# Patient Record
Sex: Female | Born: 1985
Health system: Southern US, Community
[De-identification: ages and names within clinical notes are randomized; demographics above are authoritative.]

## PROBLEM LIST (undated history)

## (undated) DIAGNOSIS — Z8619 Personal history of other infectious and parasitic diseases: Secondary | ICD-10-CM

## (undated) DIAGNOSIS — F32A Depression, unspecified: Secondary | ICD-10-CM

## (undated) DIAGNOSIS — F909 Attention-deficit hyperactivity disorder, unspecified type: Secondary | ICD-10-CM

## (undated) DIAGNOSIS — F419 Anxiety disorder, unspecified: Secondary | ICD-10-CM

## (undated) DIAGNOSIS — N3010 Interstitial cystitis (chronic) without hematuria: Secondary | ICD-10-CM

## (undated) DIAGNOSIS — G4700 Insomnia, unspecified: Secondary | ICD-10-CM

## (undated) HISTORY — PX: KNEE ARTHROSCOPY W/ MENISCAL REPAIR: SHX1877

## (undated) HISTORY — DX: Anxiety disorder, unspecified: F41.9

## (undated) HISTORY — DX: Interstitial cystitis (chronic) without hematuria: N30.10

## (undated) HISTORY — DX: Attention-deficit hyperactivity disorder, unspecified type: F90.9

## (undated) HISTORY — DX: Insomnia, unspecified: G47.00

## (undated) HISTORY — DX: Depression, unspecified: F32.A

## (undated) HISTORY — DX: Personal history of other infectious and parasitic diseases: Z86.19

---

## 2014-08-18 DIAGNOSIS — Z8742 Personal history of other diseases of the female genital tract: Secondary | ICD-10-CM | POA: Insufficient documentation

## 2014-08-18 DIAGNOSIS — E66811 Obesity, class 1: Secondary | ICD-10-CM | POA: Insufficient documentation

## 2014-08-18 DIAGNOSIS — E669 Obesity, unspecified: Secondary | ICD-10-CM | POA: Insufficient documentation

## 2016-07-20 DIAGNOSIS — J340 Abscess, furuncle and carbuncle of nose: Secondary | ICD-10-CM | POA: Diagnosis not present

## 2016-09-19 DIAGNOSIS — Z8742 Personal history of other diseases of the female genital tract: Secondary | ICD-10-CM | POA: Diagnosis not present

## 2016-09-19 DIAGNOSIS — Z30430 Encounter for insertion of intrauterine contraceptive device: Secondary | ICD-10-CM | POA: Diagnosis not present

## 2016-10-11 DIAGNOSIS — Z113 Encounter for screening for infections with a predominantly sexual mode of transmission: Secondary | ICD-10-CM | POA: Diagnosis not present

## 2016-10-11 DIAGNOSIS — Z30432 Encounter for removal of intrauterine contraceptive device: Secondary | ICD-10-CM | POA: Diagnosis not present

## 2016-10-11 DIAGNOSIS — Z01812 Encounter for preprocedural laboratory examination: Secondary | ICD-10-CM | POA: Diagnosis not present

## 2016-10-11 DIAGNOSIS — Z30430 Encounter for insertion of intrauterine contraceptive device: Secondary | ICD-10-CM | POA: Diagnosis not present

## 2016-11-01 DIAGNOSIS — Z30433 Encounter for removal and reinsertion of intrauterine contraceptive device: Secondary | ICD-10-CM | POA: Diagnosis not present

## 2016-11-01 DIAGNOSIS — T8332XA Displacement of intrauterine contraceptive device, initial encounter: Secondary | ICD-10-CM | POA: Diagnosis not present

## 2016-11-01 DIAGNOSIS — R102 Pelvic and perineal pain: Secondary | ICD-10-CM | POA: Diagnosis not present

## 2016-12-10 DIAGNOSIS — R05 Cough: Secondary | ICD-10-CM | POA: Diagnosis not present

## 2016-12-13 DIAGNOSIS — R319 Hematuria, unspecified: Secondary | ICD-10-CM | POA: Diagnosis not present

## 2016-12-16 DIAGNOSIS — R350 Frequency of micturition: Secondary | ICD-10-CM | POA: Diagnosis not present

## 2016-12-16 DIAGNOSIS — R310 Gross hematuria: Secondary | ICD-10-CM | POA: Diagnosis not present

## 2016-12-16 DIAGNOSIS — N390 Urinary tract infection, site not specified: Secondary | ICD-10-CM | POA: Diagnosis not present

## 2016-12-27 DIAGNOSIS — N3020 Other chronic cystitis without hematuria: Secondary | ICD-10-CM | POA: Diagnosis not present

## 2017-01-18 DIAGNOSIS — N200 Calculus of kidney: Secondary | ICD-10-CM | POA: Diagnosis not present

## 2017-01-18 DIAGNOSIS — R310 Gross hematuria: Secondary | ICD-10-CM | POA: Diagnosis not present

## 2017-01-18 DIAGNOSIS — N3020 Other chronic cystitis without hematuria: Secondary | ICD-10-CM | POA: Diagnosis not present

## 2017-02-10 DIAGNOSIS — N3020 Other chronic cystitis without hematuria: Secondary | ICD-10-CM | POA: Diagnosis not present

## 2017-02-10 DIAGNOSIS — R350 Frequency of micturition: Secondary | ICD-10-CM | POA: Diagnosis not present

## 2017-02-14 DIAGNOSIS — F411 Generalized anxiety disorder: Secondary | ICD-10-CM | POA: Diagnosis not present

## 2017-02-24 DIAGNOSIS — N3020 Other chronic cystitis without hematuria: Secondary | ICD-10-CM | POA: Diagnosis not present

## 2017-02-24 DIAGNOSIS — N3010 Interstitial cystitis (chronic) without hematuria: Secondary | ICD-10-CM | POA: Diagnosis not present

## 2017-03-17 DIAGNOSIS — R350 Frequency of micturition: Secondary | ICD-10-CM | POA: Diagnosis not present

## 2017-04-22 DIAGNOSIS — N760 Acute vaginitis: Secondary | ICD-10-CM | POA: Diagnosis not present

## 2017-06-28 DIAGNOSIS — F411 Generalized anxiety disorder: Secondary | ICD-10-CM | POA: Diagnosis not present

## 2017-08-07 DIAGNOSIS — N760 Acute vaginitis: Secondary | ICD-10-CM | POA: Diagnosis not present

## 2017-08-07 DIAGNOSIS — Z124 Encounter for screening for malignant neoplasm of cervix: Secondary | ICD-10-CM | POA: Diagnosis not present

## 2017-08-07 DIAGNOSIS — N898 Other specified noninflammatory disorders of vagina: Secondary | ICD-10-CM | POA: Diagnosis not present

## 2017-08-07 DIAGNOSIS — Z01419 Encounter for gynecological examination (general) (routine) without abnormal findings: Secondary | ICD-10-CM | POA: Diagnosis not present

## 2017-08-07 LAB — HM PAP SMEAR: HM Pap smear: NEGATIVE

## 2017-09-20 DIAGNOSIS — L02416 Cutaneous abscess of left lower limb: Secondary | ICD-10-CM | POA: Diagnosis not present

## 2017-10-25 DIAGNOSIS — F909 Attention-deficit hyperactivity disorder, unspecified type: Secondary | ICD-10-CM | POA: Diagnosis not present

## 2017-10-25 DIAGNOSIS — F411 Generalized anxiety disorder: Secondary | ICD-10-CM | POA: Diagnosis not present

## 2017-12-06 DIAGNOSIS — B373 Candidiasis of vulva and vagina: Secondary | ICD-10-CM | POA: Diagnosis not present

## 2018-01-22 DIAGNOSIS — F900 Attention-deficit hyperactivity disorder, predominantly inattentive type: Secondary | ICD-10-CM | POA: Diagnosis not present

## 2018-01-22 DIAGNOSIS — F411 Generalized anxiety disorder: Secondary | ICD-10-CM | POA: Diagnosis not present

## 2018-02-15 DIAGNOSIS — N760 Acute vaginitis: Secondary | ICD-10-CM | POA: Diagnosis not present

## 2018-02-15 DIAGNOSIS — N898 Other specified noninflammatory disorders of vagina: Secondary | ICD-10-CM | POA: Diagnosis not present

## 2018-02-15 DIAGNOSIS — N926 Irregular menstruation, unspecified: Secondary | ICD-10-CM | POA: Diagnosis not present

## 2018-02-15 DIAGNOSIS — Z30431 Encounter for routine checking of intrauterine contraceptive device: Secondary | ICD-10-CM | POA: Diagnosis not present

## 2018-03-01 DIAGNOSIS — Z3202 Encounter for pregnancy test, result negative: Secondary | ICD-10-CM | POA: Diagnosis not present

## 2018-03-01 DIAGNOSIS — B373 Candidiasis of vulva and vagina: Secondary | ICD-10-CM | POA: Diagnosis not present

## 2018-03-07 DIAGNOSIS — R232 Flushing: Secondary | ICD-10-CM | POA: Diagnosis not present

## 2018-03-07 DIAGNOSIS — R4586 Emotional lability: Secondary | ICD-10-CM | POA: Diagnosis not present

## 2018-03-07 DIAGNOSIS — N898 Other specified noninflammatory disorders of vagina: Secondary | ICD-10-CM | POA: Diagnosis not present

## 2018-03-07 DIAGNOSIS — N760 Acute vaginitis: Secondary | ICD-10-CM | POA: Diagnosis not present

## 2018-03-07 DIAGNOSIS — R5383 Other fatigue: Secondary | ICD-10-CM | POA: Diagnosis not present

## 2018-05-02 DIAGNOSIS — F900 Attention-deficit hyperactivity disorder, predominantly inattentive type: Secondary | ICD-10-CM | POA: Diagnosis not present

## 2018-05-02 DIAGNOSIS — F411 Generalized anxiety disorder: Secondary | ICD-10-CM | POA: Diagnosis not present

## 2018-05-30 ENCOUNTER — Other Ambulatory Visit: Payer: Self-pay

## 2018-05-30 ENCOUNTER — Encounter: Payer: Self-pay | Admitting: Physician Assistant

## 2018-05-30 ENCOUNTER — Ambulatory Visit (INDEPENDENT_AMBULATORY_CARE_PROVIDER_SITE_OTHER): Payer: BLUE CROSS/BLUE SHIELD | Admitting: Physician Assistant

## 2018-05-30 VITALS — BP 116/86 | HR 88 | Temp 97.7°F | Resp 14 | Ht 69.0 in | Wt 210.0 lb

## 2018-05-30 DIAGNOSIS — F901 Attention-deficit hyperactivity disorder, predominantly hyperactive type: Secondary | ICD-10-CM | POA: Insufficient documentation

## 2018-05-30 DIAGNOSIS — E282 Polycystic ovarian syndrome: Secondary | ICD-10-CM | POA: Diagnosis not present

## 2018-05-30 LAB — LIPID PANEL
Cholesterol: 159 mg/dL (ref 0–200)
HDL: 62.4 mg/dL (ref >39.00)
LDL Cholesterol: 82 mg/dL (ref 0–99)
NonHDL: 96.95
Total CHOL/HDL Ratio: 3
Triglycerides: 75 mg/dL (ref 0.0–149.0)
VLDL: 15 mg/dL (ref 0.0–40.0)

## 2018-05-30 LAB — COMPREHENSIVE METABOLIC PANEL
ALT: 12 U/L (ref 0–35)
AST: 15 U/L (ref 0–37)
Albumin: 4.3 g/dL (ref 3.5–5.2)
Alkaline Phosphatase: 65 U/L (ref 39–117)
BUN: 16 mg/dL (ref 6–23)
CO2: 30 mEq/L (ref 19–32)
Calcium: 9.3 mg/dL (ref 8.4–10.5)
Chloride: 100 mEq/L (ref 96–112)
Creatinine, Ser: 1.02 mg/dL (ref 0.40–1.20)
GFR: 65.69 mL/min (ref >60.00)
Glucose, Bld: 87 mg/dL (ref 70–99)
Potassium: 4.2 mEq/L (ref 3.5–5.1)
Sodium: 136 mEq/L (ref 135–145)
Total Bilirubin: 0.3 mg/dL (ref 0.2–1.2)
Total Protein: 7 g/dL (ref 6.0–8.3)

## 2018-05-30 LAB — HEMOGLOBIN A1C: Hgb A1c MFr Bld: 5 % (ref 4.6–6.5)

## 2018-05-30 MED ORDER — METFORMIN HCL 500 MG PO TABS: 500.0000 mg | ORAL_TABLET | Freq: Two times a day (BID) | ORAL | 5 refills | Status: DC

## 2018-05-30 MED ORDER — SPIRONOLACTONE 100 MG PO TABS: 100.0000 mg | ORAL_TABLET | Freq: Every day | ORAL | 5 refills | Status: DC

## 2018-05-30 NOTE — Patient Instructions (Signed)
Please go to the lab today for blood work.  I will call you with your results. We will alter treatment regimen(s) if indicated by your results.   I have sent in refills of your medications.  Please follow-up with your specialists as scheduled.   For the interstitial cystitis, please read information below. Can consider addition of over-the-counter Tagamet for episodes if needed.    Interstitial Cystitis Interstitial cystitis is a condition that causes inflammation of the bladder. The bladder is a hollow organ in the lower part of your abdomen. It stores urine after the urine is made by your kidneys. With interstitial cystitis, you may have pain in the bladder area. You may also have a frequent and urgent need to urinate. The severity of interstitial cystitis can vary from person to person. You may have flare-ups of the condition, and then it may go away for a while. For many people who have this condition, it becomes a long-term problem. What are the causes? The cause of this condition is not known. What increases the risk? This condition is more likely to develop in women. What are the signs or symptoms? Symptoms of interstitial cystitis vary, and they can change over time. Symptoms may include:  Discomfort or pain in the bladder area. This can range from mild to severe. The pain may change in intensity as the bladder fills with urine or as it empties.  Pelvic pain.  An urgent need to urinate.  Frequent urination.  Pain during sexual intercourse.  Pinpoint bleeding on the bladder wall.  For women, the symptoms often get worse during menstruation. How is this diagnosed? This condition is diagnosed by evaluating your symptoms and ruling out other causes. A physical exam will be done. Various tests may be done to rule out other conditions. Common tests include:  Urine tests.  Cystoscopy. In this test, a tool that is like a very thin telescope is used to look into your  bladder.  Biopsy. This involves taking a sample of tissue from the bladder wall to be examined under a microscope.  How is this treated? There is no cure for interstitial cystitis, but treatment methods are available to control your symptoms. Work closely with your health care provider to find the treatments that will be most effective for you. Treatment options may include:  Medicines to relieve pain and to help reduce the number of times that you feel the need to urinate.  Bladder training. This involves learning ways to control when you urinate, such as: ? Urinating at scheduled times. ? Training yourself to delay urination. ? Doing exercises (Kegel exercises) to strengthen the muscles that control urine flow.  Lifestyle changes, such as changing your diet or taking steps to control stress.  Use of a device that provides electrical stimulation in order to reduce pain.  A procedure that stretches your bladder by filling it with air or fluid.  Surgery. This is rare. It is only done for extreme cases if other treatments do not help.  Follow these instructions at home:  Take medicines only as directed by your health care provider.  Use bladder training techniques as directed. ? Keep a bladder diary to find out which foods, liquids, or activities make your symptoms worse. ? Use your bladder diary to schedule bathroom trips. If you are away from home, plan to be near a bathroom at each of your scheduled times. ? Make sure you urinate just before you leave the house and just before you  go to bed.  Do Kegel exercises as directed by your health care provider.  Do not drink alcohol.  Do not use any tobacco products, including cigarettes, chewing tobacco, or electronic cigarettes. If you need help quitting, ask your health care provider.  Make dietary changes as directed by your health care provider. You may need to avoid spicy foods and foods that contain a high amount of  potassium.  Limit your drinking of beverages that stimulate urination. These include soda, coffee, and tea.  Keep all follow-up visits as directed by your health care provider. This is important. Contact a health care provider if:  Your symptoms do not get better after treatment.  Your pain and discomfort are getting worse.  You have more frequent urges to urinate.  You have a fever. Get help right away if:  You are not able to control your bladder at all. This information is not intended to replace advice given to you by your health care provider. Make sure you discuss any questions you have with your health care provider. Document Released: 02/05/2004 Document Revised: 11/12/2015 Document Reviewed: 02/11/2014 Elsevier Interactive Patient Education  [employer].

## 2018-05-30 NOTE — Progress Notes (Signed)
Patient presents to clinic today to establish care.  Acute Concerns: Denies acute concerns at today's visit. Is needing refills of medications.   Chronic Issues: PCOS -- Long-standing history, diagnosed in early 58s per patient. Is currently on a low-carb, low-chol diet, metformin 500 mg BID and Spironolactone 25 mg once daily. Notes she has done very well on this regimen. Has Paragard in place and notes regular periods. Denies known history of pre-diabetes or diabetes. Is unsure when this was checked last.   Health Maintenance: Immunizations -- Will obtain full records. Declines flu shot. PAP -- up-to-date. Followed by GYN.   Past Medical History:  Diagnosis Date  . History of chickenpox     Past Surgical History:  Procedure Laterality Date  . KNEE ARTHROSCOPY W/ MENISCAL REPAIR Left     Current Outpatient Medications on File Prior to Visit  Medication Sig Dispense Refill  . ALPRAZolam (XANAX) 1 MG tablet Take 1 tablet by mouth daily as needed.  2  . amphetamine-dextroamphetamine (ADDERALL) 30 MG tablet Take 1 tablet by mouth 4 (four) times daily.    . Melatonin 5 MG CAPS Take 2 capsules by mouth at bedtime.    Marland Kitchen PARAGARD INTRAUTERINE COPPER IUD IUD by Intrauterine route.    . traZODone (DESYREL) 100 MG tablet Take 1 tablet by mouth at bedtime.  0   No current facility-administered medications on file prior to visit.     Allergies  Allergen Reactions  . Adhesive [Tape] Rash    Family History  Problem Relation Age of Onset  . Multiple sclerosis Mother   . COPD Father   . Cancer Father        Lung  . Heart attack Father   . Cancer Paternal Aunt        Breast  . Multiple sclerosis Paternal Aunt   . Cancer Paternal Aunt        Breast    Social History   Socioeconomic History  . Marital status: Single    Spouse name: Not on file  . Number of children: Not on file  . Years of education: Not on file  . Highest education level: Not on file  Occupational  History  . Not on file  Social Needs  . Financial resource strain: Not on file  . Food insecurity:    Worry: Not on file    Inability: Not on file  . Transportation needs:    Medical: Not on file    Non-medical: Not on file  Tobacco Use  . Smoking status: Former Smoker    Types: Cigarettes    Last attempt to quit: 2010    Years since quitting: 9.9  . Smokeless tobacco: Never Used  Substance and Sexual Activity  . Alcohol use: Yes    Comment: 3-4 glasses of wine  . Drug use: Not Currently  . Sexual activity: Yes    Birth control/protection: IUD  Lifestyle  . Physical activity:    Days per week: Not on file    Minutes per session: Not on file  . Stress: Not on file  Relationships  . Social connections:    Talks on phone: Not on file    Gets together: Not on file    Attends religious service: Not on file    Active member of club or organization: Not on file    Attends meetings of clubs or organizations: Not on file    Relationship status: Not on file  . Intimate partner violence:  Fear of current or ex partner: Not on file    Emotionally abused: Not on file    Physically abused: Not on file    Forced sexual activity: Not on file  Other Topics Concern  . Not on file  Social History Narrative  . Not on file   Review of Systems  Constitutional: Negative for fever and weight loss.  HENT: Negative for ear discharge, ear pain, hearing loss and tinnitus.   Eyes: Negative for blurred vision, double vision, photophobia and pain.  Respiratory: Negative for cough and shortness of breath.   Cardiovascular: Negative for chest pain and palpitations.  Gastrointestinal: Negative for abdominal pain, blood in stool, constipation, diarrhea, heartburn, melena, nausea and vomiting.  Genitourinary: Negative for dysuria, flank pain, frequency, hematuria and urgency.  Musculoskeletal: Negative for falls.  Neurological: Negative for dizziness, loss of consciousness and headaches.    Endo/Heme/Allergies: Negative for environmental allergies.  Psychiatric/Behavioral: Negative for depression, hallucinations, substance abuse and suicidal ideas. The patient is not nervous/anxious and does not have insomnia.     BP 116/86   Pulse 88   Temp 97.7 F (36.5 C) (Oral)   Resp 14   Ht 5\' 9"  (1.753 m)   Wt 210 lb (95.3 kg)   SpO2 99%   BMI 31.01 kg/m   Physical Exam  Constitutional: She is oriented to person, place, and time.  HENT:  Head: Normocephalic and atraumatic.  Right Ear: Tympanic membrane, external ear and ear canal normal.  Left Ear: Tympanic membrane, external ear and ear canal normal.  Nose: Nose normal. No mucosal edema.  Mouth/Throat: Uvula is midline, oropharynx is clear and moist and mucous membranes are normal. No oropharyngeal exudate or posterior oropharyngeal erythema.  Eyes: Pupils are equal, round, and reactive to light. Conjunctivae are normal.  Neck: Neck supple. No thyromegaly present.  Cardiovascular: Normal rate, regular rhythm, normal heart sounds and intact distal pulses.  Pulmonary/Chest: Effort normal and breath sounds normal. No respiratory distress. She has no wheezes. She has no rales.  Abdominal: Soft. Bowel sounds are normal. She exhibits no distension and no mass. There is no tenderness. There is no rebound and no guarding.  Lymphadenopathy:    She has no cervical adenopathy.  Neurological: She is alert and oriented to person, place, and time. No cranial nerve deficit.  Skin: Skin is warm and dry. No rash noted.  Vitals reviewed.   Recent Results (from the past 2160 hour(s))  Lipid panel     Status: None   Collection Time: 05/30/18  9:07 AM  Result Value Ref Range   Cholesterol 159 0 - 200 mg/dL    Comment: ATP III Classification       Desirable:  < 200 mg/dL               Borderline High:  200 - 239 mg/dL          High:  > = 240 mg/dL   Triglycerides 75.0 0.0 - 149.0 mg/dL    Comment: Normal:  <150 mg/dLBorderline High:  150 -  199 mg/dL   HDL 62.40 >39.00 mg/dL   VLDL 15.0 0.0 - 40.0 mg/dL   LDL Cholesterol 82 0 - 99 mg/dL   Total CHOL/HDL Ratio 3     Comment:                Men          Women1/2 Average Risk     3.4  3.3Average Risk          5.0          4.42X Average Risk          9.6          7.13X Average Risk          15.0          11.0                       NonHDL 96.95     Comment: NOTE:  Non-HDL goal should be 30 mg/dL higher than patient's LDL goal (i.e. LDL goal of < 70 mg/dL, would have non-HDL goal of < 100 mg/dL)  Comprehensive metabolic panel     Status: None   Collection Time: 05/30/18  9:07 AM  Result Value Ref Range   Sodium 136 135 - 145 mEq/L   Potassium 4.2 3.5 - 5.1 mEq/L   Chloride 100 96 - 112 mEq/L   CO2 30 19 - 32 mEq/L   Glucose, Bld 87 70 - 99 mg/dL   BUN 16 6 - 23 mg/dL   Creatinine, Ser 1.02 0.40 - 1.20 mg/dL   Total Bilirubin 0.3 0.2 - 1.2 mg/dL   Alkaline Phosphatase 65 39 - 117 U/L   AST 15 0 - 37 U/L   ALT 12 0 - 35 U/L   Total Protein 7.0 6.0 - 8.3 g/dL   Albumin 4.3 3.5 - 5.2 g/dL   Calcium 9.3 8.4 - 10.5 mg/dL   GFR 65.69 >60.00 mL/min  Hemoglobin A1c     Status: None   Collection Time: 05/30/18  9:07 AM  Result Value Ref Range   Hgb A1c MFr Bld 5.0 4.6 - 6.5 %    Comment: Glycemic Control Guidelines for People with Diabetes:Non Diabetic:  <6%Goal of Therapy: <7%Additional Action Suggested:  >8%    Assessment/Plan: PCOS (polycystic ovarian syndrome) Will check lab panel today to include A1C, CMP and Lipids. Continue current regimen for now. Dietary and exercise recommendations reviewed with patient.   Attention deficit hyperactivity disorder (ADHD), predominantly hyperactive type Managed by Psychiatry. Continue care as directed by specialist.     Leeanne Rio, PA-C

## 2018-05-30 NOTE — Assessment & Plan Note (Signed)
Will check lab panel today to include A1C, CMP and Lipids. Continue current regimen for now. Dietary and exercise recommendations reviewed with patient.

## 2018-05-30 NOTE — Assessment & Plan Note (Signed)
Managed by Psychiatry. Continue care as directed by specialist.

## 2018-08-01 DIAGNOSIS — F900 Attention-deficit hyperactivity disorder, predominantly inattentive type: Secondary | ICD-10-CM | POA: Diagnosis not present

## 2018-08-01 DIAGNOSIS — F411 Generalized anxiety disorder: Secondary | ICD-10-CM | POA: Diagnosis not present

## 2018-08-03 ENCOUNTER — Encounter: Payer: Self-pay | Admitting: Physician Assistant

## 2018-08-03 ENCOUNTER — Other Ambulatory Visit: Payer: Self-pay

## 2018-08-03 ENCOUNTER — Ambulatory Visit: Payer: BLUE CROSS/BLUE SHIELD | Admitting: Physician Assistant

## 2018-08-03 ENCOUNTER — Other Ambulatory Visit (HOSPITAL_COMMUNITY)
Admission: RE | Admit: 2018-08-03 | Discharge: 2018-08-03 | Disposition: A | Discharge status: Home / Self Care | Payer: BLUE CROSS/BLUE SHIELD | Source: Ambulatory Visit | Attending: Physician Assistant | Admitting: Physician Assistant

## 2018-08-03 VITALS — BP 124/84 | HR 97 | Temp 98.3°F | Resp 16 | Ht 69.0 in | Wt 212.0 lb

## 2018-08-03 DIAGNOSIS — N898 Other specified noninflammatory disorders of vagina: Secondary | ICD-10-CM

## 2018-08-03 DIAGNOSIS — N941 Unspecified dyspareunia: Secondary | ICD-10-CM | POA: Diagnosis not present

## 2018-08-03 DIAGNOSIS — N83202 Unspecified ovarian cyst, left side: Secondary | ICD-10-CM | POA: Diagnosis not present

## 2018-08-03 LAB — POCT URINALYSIS DIPSTICK
Bilirubin, UA: NEGATIVE
Glucose, UA: NEGATIVE
Ketones, UA: NEGATIVE
Leukocytes, UA: NEGATIVE
Nitrite, UA: NEGATIVE
Protein, UA: NEGATIVE
Spec Grav, UA: 1.01 (ref 1.010–1.025)
Urobilinogen, UA: 0.2 E.U./dL
pH, UA: 6.5 (ref 5.0–8.0)

## 2018-08-03 LAB — POCT URINE PREGNANCY: Preg Test, Ur: NEGATIVE

## 2018-08-03 NOTE — Progress Notes (Signed)
Patient presents to clinic today c/o 2 weeks of some dyspareunia associated with vaginal discharge and odor. Denies concern for STI as she has been in a monogamous relationship with the same partner. Says it almost feels like a soreness in the vaginal walls themselves. Notes history of BV and yeast. Denies itch or external lesion. Denies change to soap, lotions, detergents, etc. No change in personal lubricants for her or husband. Patient also notes a couple of days of L pelvic pain that was aching and cramping in nature. Has history of PCOS and ruptured ovarian cysts. States this pain feels similar and is already much improved. Starter her menstrual period yesterday. Notes is same as prior periods. Denies change to urinary or bowel habits. Paragard in place.   Past Medical History:  Diagnosis Date  . History of chickenpox     Current Outpatient Medications on File Prior to Visit  Medication Sig Dispense Refill  . ALPRAZolam (XANAX) 1 MG tablet Take 1 tablet by mouth daily as needed.  2  . amphetamine-dextroamphetamine (ADDERALL) 30 MG tablet Take 1 tablet by mouth 4 (four) times daily.    . Melatonin 5 MG CAPS Take 2 capsules by mouth at bedtime.    . metFORMIN (GLUCOPHAGE) 500 MG tablet Take 1 tablet (500 mg total) by mouth 2 (two) times daily. 60 tablet 5  . PARAGARD INTRAUTERINE COPPER IUD IUD by Intrauterine route.    Marland Kitchen spironolactone (ALDACTONE) 100 MG tablet Take 1 tablet (100 mg total) by mouth daily. 30 tablet 5  . traZODone (DESYREL) 100 MG tablet Take 1 tablet by mouth at bedtime.  0   No current facility-administered medications on file prior to visit.     Allergies  Allergen Reactions  . Adhesive [Tape] Rash    Family History  Problem Relation Age of Onset  . Multiple sclerosis Mother   . COPD Father   . Cancer Father        Lung  . Heart attack Father   . Cancer Paternal Aunt        Breast  . Multiple sclerosis Paternal Aunt   . Cancer Paternal Aunt        Breast     Social History   Socioeconomic History  . Marital status: Single    Spouse name: Not on file  . Number of children: Not on file  . Years of education: Not on file  . Highest education level: Not on file  Occupational History  . Not on file  Social Needs  . Financial resource strain: Not on file  . Food insecurity:    Worry: Not on file    Inability: Not on file  . Transportation needs:    Medical: Not on file    Non-medical: Not on file  Tobacco Use  . Smoking status: Former Smoker    Types: Cigarettes    Last attempt to quit: 2010    Years since quitting: 10.1  . Smokeless tobacco: Never Used  Substance and Sexual Activity  . Alcohol use: Yes    Comment: 3-4 glasses of wine  . Drug use: Not Currently  . Sexual activity: Yes    Birth control/protection: I.U.D.  Lifestyle  . Physical activity:    Days per week: Not on file    Minutes per session: Not on file  . Stress: Not on file  Relationships  . Social connections:    Talks on phone: Not on file    Gets together: Not on  file    Attends religious service: Not on file    Active member of club or organization: Not on file    Attends meetings of clubs or organizations: Not on file    Relationship status: Not on file  Other Topics Concern  . Not on file  Social History Narrative  . Not on file   Review of Systems - See HPI.  All other ROS are negative.  BP 124/84   Pulse 97   Temp 98.3 F (36.8 C) (Oral)   Resp 16   Ht 5\' 9"  (1.753 m)   Wt 212 lb (96.2 kg)   SpO2 99%   BMI 31.31 kg/m   Physical Exam Exam conducted with a chaperone present.  Constitutional:      Appearance: Normal appearance.  HENT:     Head: Normocephalic and atraumatic.  Eyes:     Conjunctiva/sclera: Conjunctivae normal.  Cardiovascular:     Rate and Rhythm: Normal rate and regular rhythm.     Heart sounds: Normal heart sounds.  Pulmonary:     Effort: Pulmonary effort is normal.     Breath sounds: Normal breath sounds.    Abdominal:     General: Bowel sounds are normal. There is no distension.     Palpations: Abdomen is soft. There is no mass.     Tenderness: There is abdominal tenderness in the left lower quadrant. There is no right CVA tenderness, left CVA tenderness, guarding or rebound.     Hernia: No hernia is present. There is no hernia in the right inguinal area or left inguinal area.  Genitourinary:    Exam position: Supine.     Labia:        Right: No rash or tenderness.        Left: No rash or tenderness.      Urethra: No prolapse, urethral swelling or urethral lesion.     Vagina: No foreign body. Bleeding (on menstural period) present. No erythema or tenderness.     Cervix: Normal.     Uterus: Absent.      Adnexa:        Right: No mass, tenderness or fullness.         Left: Tenderness present. No mass or fullness.    Lymphadenopathy:     Lower Body: No right inguinal adenopathy. No left inguinal adenopathy.  Neurological:     Mental Status: She is alert.    Recent Results (from the past 2160 hour(s))  Lipid panel     Status: None   Collection Time: 05/30/18  9:07 AM  Result Value Ref Range   Cholesterol 159 0 - 200 mg/dL    Comment: ATP III Classification       Desirable:  < 200 mg/dL               Borderline High:  200 - 239 mg/dL          High:  > = 240 mg/dL   Triglycerides 75.0 0.0 - 149.0 mg/dL    Comment: Normal:  <150 mg/dLBorderline High:  150 - 199 mg/dL   HDL 62.40 >39.00 mg/dL   VLDL 15.0 0.0 - 40.0 mg/dL   LDL Cholesterol 82 0 - 99 mg/dL   Total CHOL/HDL Ratio 3     Comment:                Men          Women1/2 Average Risk     3.4  3.3Average Risk          5.0          4.42X Average Risk          9.6          7.13X Average Risk          15.0          11.0                       NonHDL 96.95     Comment: NOTE:  Non-HDL goal should be 30 mg/dL higher than patient's LDL goal (i.e. LDL goal of < 70 mg/dL, would have non-HDL goal of < 100 mg/dL)  Comprehensive  metabolic panel     Status: None   Collection Time: 05/30/18  9:07 AM  Result Value Ref Range   Sodium 136 135 - 145 mEq/L   Potassium 4.2 3.5 - 5.1 mEq/L   Chloride 100 96 - 112 mEq/L   CO2 30 19 - 32 mEq/L   Glucose, Bld 87 70 - 99 mg/dL   BUN 16 6 - 23 mg/dL   Creatinine, Ser 1.02 0.40 - 1.20 mg/dL   Total Bilirubin 0.3 0.2 - 1.2 mg/dL   Alkaline Phosphatase 65 39 - 117 U/L   AST 15 0 - 37 U/L   ALT 12 0 - 35 U/L   Total Protein 7.0 6.0 - 8.3 g/dL   Albumin 4.3 3.5 - 5.2 g/dL   Calcium 9.3 8.4 - 10.5 mg/dL   GFR 65.69 >60.00 mL/min  Hemoglobin A1c     Status: None   Collection Time: 05/30/18  9:07 AM  Result Value Ref Range   Hgb A1c MFr Bld 5.0 4.6 - 6.5 %    Comment: Glycemic Control Guidelines for People with Diabetes:Non Diabetic:  <6%Goal of Therapy: <7%Additional Action Suggested:  >8%    Assessment/Plan: 1. Dyspareunia in female 2. Vaginal discharge Pelvic exam today reveals menses but negative for vaginal lesion, erythema, CMT. Left adnexal tenderness consistent with ovarian cyst. Urine preg and urine dip negative. Will check urine ancillary for yeast and BV. Declines STI testing.  - POCT Urinalysis Dipstick - Urine cytology ancillary only  3. Cyst of left ovary Suspected giving history, symptom course and exam. OTC pain relievers discussed. Heating pad to lower abdomen. Monitor symptoms closely. If not continuing to improve, will need [place] and GYN assessment.     Leeanne Rio, PA-C

## 2018-08-03 NOTE — Patient Instructions (Signed)
Please keep hydrated. Tylenol or Pamprin for pain. I do think you have had a mild ovarian cyst rupture giving symptoms and exam findings. It is good that this is already improving.   The vaginal walls and tissues look healthy.  Thankfully there is no cervical tenderness on exam. We are sending specimens for further testing.  If negative we will need assessment with Gynecology.  We will treat based on results.

## 2018-08-07 LAB — URINE CYTOLOGY ANCILLARY ONLY
Bacterial vaginitis: NEGATIVE
Candida vaginitis: NEGATIVE

## 2018-09-06 ENCOUNTER — Telehealth: Payer: BLUE CROSS/BLUE SHIELD | Admitting: Physician Assistant

## 2018-09-06 ENCOUNTER — Encounter: Payer: Self-pay | Admitting: Physician Assistant

## 2018-09-06 DIAGNOSIS — B373 Candidiasis of vulva and vagina: Secondary | ICD-10-CM

## 2018-09-06 DIAGNOSIS — B3731 Acute candidiasis of vulva and vagina: Secondary | ICD-10-CM

## 2018-09-06 DIAGNOSIS — R300 Dysuria: Secondary | ICD-10-CM

## 2018-09-06 MED ORDER — FLUCONAZOLE 150 MG PO TABS: 150.0000 mg | ORAL_TABLET | Freq: Once | ORAL | 0 refills | Status: AC

## 2018-09-06 MED ORDER — CEPHALEXIN 500 MG PO CAPS: 500.0000 mg | ORAL_CAPSULE | Freq: Two times a day (BID) | ORAL | 0 refills | Status: AC

## 2018-09-06 NOTE — Progress Notes (Signed)
We are sorry that you are not feeling well.  Here is how we plan to help!  Based on what you shared with me it looks like you possibly have a simple urinary tract infection. It is harder to tell with your history of interstitial cystitis. Because we are trying to keep healthy people out of office for now, I will send in an antibiotic (see below) to cover for a bladder infection while you continue supportive measures and dietary changes for potential IC flare. I am sending in a Diflucan to cover for the yeast.   If not improving, you will need to come see me in office or see your Gynecologist.   A UTI (Urinary Tract Infection) is a bacterial infection of the bladder.  Most cases of urinary tract infections are simple to treat but a key part of your care is to encourage you to drink plenty of fluids and watch your symptoms carefully.  I have prescribed Keflex 500 mg twice a day for 7 days.  Your symptoms should gradually improve. Call us if the burning in your urine worsens, you develop worsening fever, back pain or pelvic pain or if your symptoms do not resolve after completing the antibiotic.  Urinary tract infections can be prevented by drinking plenty of water to keep your body hydrated.  Also be sure when you wipe, wipe from front to back and don't hold it in!  If possible, empty your bladder every 4 hours.  Your e-visit answers were reviewed by a board certified advanced clinical practitioner to complete your personal care plan.  Depending on the condition, your plan could have included both over the counter or prescription medications.  If there is a problem please reply  once you have received a response from your provider.  Your safety is important to [place].  If you have drug allergies check your prescription carefully.    You can use MyChart to ask questions about today's visit, request a non-urgent call back, or ask for a work or school excuse for 24 hours related to this e-Visit. If it has  been greater than 24 hours you will need to follow up with your provider, or enter a new e-Visit to address those concerns.   You will get an e-mail in the next two days asking about your experience.  I hope that your e-visit has been valuable and will speed your recovery. Thank you for using e-visits.

## 2018-09-06 NOTE — Progress Notes (Signed)
I have spent 5 minutes in review of e-visit questionnaire, review and updating patient chart, medical decision making and response to patient.   Madyn Ivins Cody Ashaunti Treptow, PA-C    

## 2018-09-28 ENCOUNTER — Telehealth: Payer: BLUE CROSS/BLUE SHIELD | Admitting: Family

## 2018-09-28 DIAGNOSIS — B3731 Acute candidiasis of vulva and vagina: Secondary | ICD-10-CM

## 2018-09-28 DIAGNOSIS — B373 Candidiasis of vulva and vagina: Secondary | ICD-10-CM

## 2018-09-28 MED ORDER — FLUCONAZOLE 150 MG PO TABS: 150.0000 mg | ORAL_TABLET | Freq: Once | ORAL | 0 refills | Status: AC

## 2018-09-28 NOTE — Addendum Note (Signed)
Addended by: Chevis Pretty on: 09/28/2018 06:34 PM   Modules accepted: Orders

## 2018-09-28 NOTE — Progress Notes (Signed)
We are sorry that you are not feeling well. Here is how we plan to help! Based on what you shared with me it looks like you: May have a yeast vaginosis  Vaginosis is an inflammation of the vagina that can result in discharge, itching and pain. The cause is usually a change in the normal balance of vaginal bacteria or an infection. Vaginosis can also result from reduced estrogen levels after menopause.  The most common causes of vaginosis are:   Bacterial vaginosis which results from an overgrowth of one on several organisms that are normally present in your vagina.   Yeast infections which are caused by a naturally occurring fungus called candida.   Vaginal atrophy (atrophic vaginosis) which results from the thinning of the vagina from reduced estrogen levels after menopause.   Trichomoniasis which is caused by a parasite and is commonly transmitted by sexual intercourse.  Factors that increase your risk of developing vaginosis include: Marland Kitchen Medications, such as antibiotics and steroids . Uncontrolled diabetes . Use of hygiene products such as bubble bath, vaginal spray or vaginal deodorant . Douching . Wearing damp or tight-fitting clothing . Using an intrauterine device (IUD) for birth control . Hormonal changes, such as those associated with pregnancy, birth control pills or menopause . Sexual activity . Having a sexually transmitted infection  Your treatment plan is A single Diflucan (fluconazole) 150mg  tablet once.  I have electronically sent this prescription into the pharmacy that you have chosen. You may use monistat externally for relief as well.  Be sure to take all of the medication as directed. Stop taking any medication if you develop a rash, tongue swelling or shortness of breath. Mothers who are breast feeding should consider pumping and discarding their breast milk while on these antibiotics. However, there is no consensus that infant exposure at these doses would be harmful.   Remember that medication creams can weaken latex condoms. Marland Kitchen   HOME CARE:  Good hygiene may prevent some types of vaginosis from recurring and may relieve some symptoms:  . Avoid baths, hot tubs and whirlpool spas. Rinse soap from your outer genital area after a shower, and dry the area well to prevent irritation. Don't use scented or harsh soaps, such as those with deodorant or antibacterial action. Marland Kitchen Avoid irritants. These include scented tampons and pads. . Wipe from front to back after using the toilet. Doing so avoids spreading fecal bacteria to your vagina.  Other things that may help prevent vaginosis include:  Marland Kitchen Don't douche. Your vagina doesn't require cleansing other than normal bathing. Repetitive douching disrupts the normal organisms that reside in the vagina and can actually increase your risk of vaginal infection. Douching won't clear up a vaginal infection. . Use a latex condom. Both female and female latex condoms may help you avoid infections spread by sexual contact. . Wear cotton underwear. Also wear pantyhose with a cotton crotch. If you feel comfortable without it, skip wearing underwear to bed. Yeast thrives in [employer] Your symptoms should improve in the next day or two.  GET HELP RIGHT AWAY IF:  . You have pain in your lower abdomen ( pelvic area or over your ovaries) . You develop nausea or vomiting . You develop a fever . Your discharge changes or worsens . You have persistent pain with intercourse . You develop shortness of breath, a rapid pulse, or you faint.  These symptoms could be signs of problems or infections that need to be evaluated by a  medical provider now.  MAKE SURE YOU    Understand these instructions.  Will watch your condition.  Will get help right away if you are not doing well or get worse.  Your e-visit answers were reviewed by a board certified advanced clinical practitioner to complete your personal care plan. Depending  upon the condition, your plan could have included both over the counter or prescription medications. Please review your pharmacy choice to make sure that you have choses a pharmacy that is open for you to pick up any needed prescription, Your safety is important to [place]. If you have drug allergies check your prescription carefully.   You can use MyChart to ask questions about today's visit, request a non-urgent call back, or ask for a work or school excuse for 24 hours related to this e-Visit. If it has been greater than 24 hours you will need to follow up with your provider, or enter a new e-Visit to address those concerns. You will get a MyChart message within the next two days asking about your experience. I hope that your e-visit has been valuable and will speed your recovery.

## 2018-10-30 DIAGNOSIS — F900 Attention-deficit hyperactivity disorder, predominantly inattentive type: Secondary | ICD-10-CM | POA: Diagnosis not present

## 2018-10-30 DIAGNOSIS — F411 Generalized anxiety disorder: Secondary | ICD-10-CM | POA: Diagnosis not present

## 2018-11-19 ENCOUNTER — Encounter: Payer: Self-pay | Admitting: Physician Assistant

## 2018-11-19 ENCOUNTER — Telehealth: Payer: BLUE CROSS/BLUE SHIELD | Admitting: Physician Assistant

## 2018-11-19 DIAGNOSIS — N898 Other specified noninflammatory disorders of vagina: Secondary | ICD-10-CM | POA: Diagnosis not present

## 2018-11-19 DIAGNOSIS — N941 Unspecified dyspareunia: Secondary | ICD-10-CM

## 2018-11-19 MED ORDER — FLUCONAZOLE 150 MG PO TABS: 150.0000 mg | ORAL_TABLET | Freq: Once | ORAL | 0 refills | Status: AC

## 2018-11-19 NOTE — Progress Notes (Signed)
We are sorry that you are not feeling well. Here is how we plan to help! Based on what you shared with me it looks like you: May have a yeast vaginosis and vaginal pain during discharge. Review of your records shows you may have pain during intercourse in the past, please follow up with your doctor for further work and management for pain with discharge. Also, your records show that you may have had another yeast infection recently. If current symptoms of itchy vaginal discharge don't resolved with the prescribed treatment, please follow up with your doctor for further workup.  Vaginosis is an inflammation of the vagina that can result in discharge, itching and pain. The cause is usually a change in the normal balance of vaginal bacteria or an infection. Vaginosis can also result from reduced estrogen levels after menopause.  The most common causes of vaginosis are:   Bacterial vaginosis which results from an overgrowth of one on several organisms that are normally present in your vagina.   Yeast infections which are caused by a naturally occurring fungus called candida.   Vaginal atrophy (atrophic vaginosis) which results from the thinning of the vagina from reduced estrogen levels after menopause.   Trichomoniasis which is caused by a parasite and is commonly transmitted by sexual intercourse.  Factors that increase your risk of developing vaginosis include: Marland Kitchen Medications, such as antibiotics and steroids . Uncontrolled diabetes . Use of hygiene products such as bubble bath, vaginal spray or vaginal deodorant . Douching . Wearing damp or tight-fitting clothing . Using an intrauterine device (IUD) for birth control . Hormonal changes, such as those associated with pregnancy, birth control pills or menopause . Sexual activity . Having a sexually transmitted infection  Your treatment plan is A single Diflucan (fluconazole) 150mg  tablet once.  I have electronically sent this prescription into  the pharmacy that you have chosen.  Be sure to take all of the medication as directed. Stop taking any medication if you develop a rash, tongue swelling or shortness of breath. Mothers who are breast feeding should consider pumping and discarding their breast milk while on these antibiotics. However, there is no consensus that infant exposure at these doses would be harmful.  Remember that medication creams can weaken latex condoms. Marland Kitchen   HOME CARE:  Good hygiene may prevent some types of vaginosis from recurring and may relieve some symptoms:  . Avoid baths, hot tubs and whirlpool spas. Rinse soap from your outer genital area after a shower, and dry the area well to prevent irritation. Don't use scented or harsh soaps, such as those with deodorant or antibacterial action. Marland Kitchen Avoid irritants. These include scented tampons and pads. . Wipe from front to back after using the toilet. Doing so avoids spreading fecal bacteria to your vagina.  Other things that may help prevent vaginosis include:  Marland Kitchen Don't douche. Your vagina doesn't require cleansing other than normal bathing. Repetitive douching disrupts the normal organisms that reside in the vagina and can actually increase your risk of vaginal infection. Douching won't clear up a vaginal infection. . Use a latex condom. Both female and female latex condoms may help you avoid infections spread by sexual contact. . Wear cotton underwear. Also wear pantyhose with a cotton crotch. If you feel comfortable without it, skip wearing underwear to bed. Yeast thrives in [employer] Your symptoms should improve in the next day or two.  GET HELP RIGHT AWAY IF:  . You have pain in your lower abdomen (  pelvic area or over your ovaries) . You develop nausea or vomiting . You develop a fever . Your discharge changes or worsens . You have persistent pain with intercourse . You develop shortness of breath, a rapid pulse, or you faint.  These symptoms  could be signs of problems or infections that need to be evaluated by a medical provider now.  MAKE SURE YOU    Understand these instructions.  Will watch your condition.  Will get help right away if you are not doing well or get worse.  Your e-visit answers were reviewed by a board certified advanced clinical practitioner to complete your personal care plan. Depending upon the condition, your plan could have included both over the counter or prescription medications. Please review your pharmacy choice to make sure that you have choses a pharmacy that is open for you to pick up any needed prescription, Your safety is important to [place]. If you have drug allergies check your prescription carefully.   You can use MyChart to ask questions about today's visit, request a non-urgent call back, or ask for a work or school excuse for 24 hours related to this e-Visit. If it has been greater than 24 hours you will need to follow up with your provider, or enter a new e-Visit to address those concerns. You will get a MyChart message within the next two days asking about your experience. I hope that your e-visit has been valuable and will speed your recovery. I have spent 7 min in completion and review of this note- Lacy Duverney [facility]

## 2018-11-22 DIAGNOSIS — N898 Other specified noninflammatory disorders of vagina: Secondary | ICD-10-CM | POA: Diagnosis not present

## 2019-01-17 ENCOUNTER — Other Ambulatory Visit: Payer: Self-pay | Admitting: Physician Assistant

## 2019-01-28 DIAGNOSIS — N760 Acute vaginitis: Secondary | ICD-10-CM | POA: Diagnosis not present

## 2019-01-28 DIAGNOSIS — N898 Other specified noninflammatory disorders of vagina: Secondary | ICD-10-CM | POA: Diagnosis not present

## 2019-02-05 DIAGNOSIS — F900 Attention-deficit hyperactivity disorder, predominantly inattentive type: Secondary | ICD-10-CM | POA: Diagnosis not present

## 2019-02-05 DIAGNOSIS — F411 Generalized anxiety disorder: Secondary | ICD-10-CM | POA: Diagnosis not present

## 2019-03-22 ENCOUNTER — Other Ambulatory Visit: Payer: Self-pay | Admitting: Physician Assistant

## 2019-04-11 ENCOUNTER — Other Ambulatory Visit: Payer: Self-pay

## 2019-04-11 ENCOUNTER — Encounter: Payer: Self-pay | Admitting: Physician Assistant

## 2019-04-11 ENCOUNTER — Ambulatory Visit: Payer: BC Managed Care – PPO | Admitting: Physician Assistant

## 2019-04-11 VITALS — BP 100/78 | HR 76 | Temp 98.2°F | Resp 14 | Ht 69.0 in | Wt 208.0 lb

## 2019-04-11 DIAGNOSIS — L729 Follicular cyst of the skin and subcutaneous tissue, unspecified: Secondary | ICD-10-CM | POA: Diagnosis not present

## 2019-04-11 DIAGNOSIS — J339 Nasal polyp, unspecified: Secondary | ICD-10-CM

## 2019-04-11 DIAGNOSIS — B078 Other viral warts: Secondary | ICD-10-CM

## 2019-04-11 DIAGNOSIS — I7300 Raynaud's syndrome without gangrene: Secondary | ICD-10-CM | POA: Diagnosis not present

## 2019-04-11 NOTE — Progress Notes (Addendum)
Patient presents to clinic today to discuss multiple complaints.        Patient endorses lesion inside the R nares x 1 year. Is not painful or pruritic. Denies trauma or injury. Denies epistaxis. Feels the area is getting bigger. Notes she has a history of skin tags within the nose that have had to be removed by ENT before.       Patient also endorses small lumps -- 2 of her anterior thigh present for at least 1 year. Non-painful or pruritic. Feel firm to her. No drainage. Notes she can move them around with skin. Also notes a similar lesion of skin between her breasts, more-so R sided.       Patient notes ongoing issue with wart of her R index finger x 2 years. Has treated with OTC products without resolution. Over the past few months has noted a similar lesion on lateral aspect of her L foot. Non-painful.      Patient noting episodes of pallor and significant coldness of her toes and distal feet bilaterally when exposed to mildly cold temperatures. Denies pain with this. Denies purplish/violaceous hue of skin. Denies similar symptoms of hands.         Past Medical History:  Diagnosis Date  . History of chickenpox     Current Outpatient Medications on File Prior to Visit  Medication Sig Dispense Refill  . ALPRAZolam (XANAX) 1 MG tablet Take 1 tablet by mouth daily as needed.  2  . amphetamine-dextroamphetamine (ADDERALL) 30 MG tablet Take 1 tablet by mouth 4 (four) times daily.    Marland Kitchen lamoTRIgine (LAMICTAL) 25 MG tablet Take 1 tablet by mouth daily.    . Melatonin 5 MG CAPS Take 2 capsules by mouth at bedtime.    . metFORMIN (GLUCOPHAGE) 500 MG tablet TAKE 1 TABLET(500 MG) BY MOUTH TWICE DAILY 60 tablet 5  . PARAGARD INTRAUTERINE COPPER IUD IUD by Intrauterine route.    Marland Kitchen spironolactone (ALDACTONE) 100 MG tablet TAKE 1 TABLET(100 MG) BY MOUTH DAILY 30 tablet 5  . traZODone (DESYREL) 100 MG tablet Take 1 tablet by mouth at bedtime.  0   No current facility-administered medications on file  prior to visit.     Allergies  Allergen Reactions  . Adhesive [Tape] Rash    Family History  Problem Relation Age of Onset  . Multiple sclerosis Mother   . COPD Father   . Cancer Father        Lung  . Heart attack Father   . Cancer Paternal Aunt        Breast  . Multiple sclerosis Paternal Aunt   . Cancer Paternal Aunt        Breast    Social History   Socioeconomic History  . Marital status: Single    Spouse name: Not on file  . Number of children: Not on file  . Years of education: Not on file  . Highest education level: Not on file  Occupational History  . Not on file  Social Needs  . Financial resource strain: Not on file  . Food insecurity    Worry: Not on file    Inability: Not on file  . Transportation needs    Medical: Not on file    Non-medical: Not on file  Tobacco Use  . Smoking status: Former Smoker    Types: Cigarettes    Quit date: 2010    Years since quitting: 10.8  . Smokeless tobacco: Never Used  Substance  and Sexual Activity  . Alcohol use: Yes    Comment: 3-4 glasses of wine  . Drug use: Not Currently  . Sexual activity: Yes    Birth control/protection: I.U.D.  Lifestyle  . Physical activity    Days per week: Not on file    Minutes per session: Not on file  . Stress: Not on file  Relationships  . Social Herbalist on phone: Not on file    Gets together: Not on file    Attends religious service: Not on file    Active member of club or organization: Not on file    Attends meetings of clubs or organizations: Not on file    Relationship status: Not on file  Other Topics Concern  . Not on file  Social History Narrative  . Not on file   Review of Systems - See HPI.  All other ROS are negative.  BP 100/78   Pulse 76   Temp 98.2 F (36.8 C) (Temporal)   Resp 14   Ht 5\' 9"  (1.753 m)   Wt 208 lb (94.3 kg)   SpO2 99%   BMI 30.72 kg/m   Physical Exam Vitals signs reviewed.  Constitutional:      Appearance: Normal  appearance.  HENT:     Head: Normocephalic and atraumatic.     Nose: Nose normal.  Neck:     Musculoskeletal: Neck supple.  Cardiovascular:     Rate and Rhythm: Normal rate and regular rhythm.     Heart sounds: Normal heart sounds.  Skin:      Neurological:     General: No focal deficit present.     Mental Status: She is alert and oriented to person, place, and time.  Psychiatric:        Mood and Affect: Mood normal.     Assessment/Plan: 1. Other viral warts Patient gave verbal consent today for Cryotherapy. Cryotherapy applied x 2 today in office. Tolerated very well without complication. Supportive measures reviewed. Follow-up discussed.   2. Nasal polyp Will refer to ENT for further management of this.  - Ambulatory referral to ENT  3. Subcutaneous cyst X 3 -- discussed benign etiology of these lesions. Removal is for cosmetic purposes if otherwise asymptomatic. She would like the one of chest remove. Not so concerned about the others.  - Ambulatory referral to Dermatology  4. Raynaud's phenomenon without gangrene Very mild. Non-painful. Supportive measures -- wearing thicker socks or doubling up when colder. No indication for medication at current time.    Leeanne Rio, PA-C

## 2019-04-11 NOTE — Patient Instructions (Signed)
Please keep skin clean and dry. If we need to repeat cryotherapy, would do in 10-14 days. Hopefully one treatment will at least take care of small wart on foot. Finger will likely need another treatment.   You will be contacted by ENT and Dermatology. Keep your phone on for this.    Raynaud Phenomenon  Raynaud phenomenon is a condition that affects the blood vessels (arteries) that carry blood to your fingers and toes. The arteries that supply blood to your ears, lips, nipples, or the tip of your nose might also be affected. Raynaud phenomenon causes the arteries to become narrow temporarily (spasm). As a result, the flow of blood to the affected areas is temporarily decreased. This usually occurs in response to cold temperatures or stress. During an attack, the skin in the affected areas turns white, then blue, and finally red. You may also feel tingling or numbness in those areas. Attacks usually last for only a brief period, and then the blood flow to the area returns to normal. In most cases, Raynaud phenomenon does not cause serious health problems. What are the causes? In many cases, the cause of this condition is not known. The condition may occur on its own (primary Raynaud phenomenon) or may be associated with other diseases or factors (secondary Raynaud phenomenon). Possible causes may include:  Diseases or medical conditions that damage the arteries.  Injuries and repetitive actions that hurt the hands or feet.  Being exposed to certain chemicals.  Taking medicines that narrow the arteries.  Other medical conditions, such as lupus, scleroderma, rheumatoid arthritis, thyroid problems, blood disorders, Sjogren syndrome, or atherosclerosis. What increases the risk? The following factors may make you more likely to develop this condition:  Being 60-78 years old.  Being female.  Having a family history of Raynaud phenomenon.  Living in a cold climate.  Smoking. What are the  signs or symptoms? Symptoms of this condition usually occur when you are exposed to cold temperatures or when you have emotional stress. The symptoms may last for a few minutes or up to several hours. They usually affect your fingers but may also affect your toes, nipples, lips, ears, or the tip of your nose. Symptoms may include:  Changes in skin color. The skin in the affected areas will turn pale or white. The skin may then change from white to bluish to red as normal blood flow returns to the area.  Numbness, tingling, or pain in the affected areas. In severe cases, symptoms may include:  Skin sores.  Tissues decaying and dying (gangrene). How is this diagnosed? This condition may be diagnosed based on:  Your symptoms and medical history.  A physical exam. During the exam, you may be asked to put your hands in cold water to check for a reaction to cold temperature.  Tests, such as: ? Blood tests to check for other diseases or conditions. ? A test to check the movement of blood through your arteries and veins (vascular ultrasound). ? A test in which the skin at the base of your fingernail is examined under a microscope (nailfold capillaroscopy). How is this treated? Treatment for this condition often involves making lifestyle changes and taking steps to control your exposure to cold temperatures. For more severe cases, medicine (calcium channel blockers) may be used to improve blood flow. Surgery is sometimes done to block the nerves that control the affected arteries, but this is rare. Follow these instructions at home: Avoiding cold temperatures Take these steps to  avoid exposure to cold:  If possible, stay indoors during cold weather.  When you go outside during cold weather, dress in layers and wear mittens, a hat, a scarf, and warm footwear.  Wear mittens or gloves when handling ice or frozen food.  Use holders for glasses or cans containing cold drinks.  Let warm water  run for a while before taking a shower or bath.  Warm up the car before driving in cold weather. Lifestyle   If possible, avoid stressful and emotional situations. Try to find ways to manage your stress, such as: ? Exercise. ? Yoga. ? Meditation. ? Biofeedback.  Do not use any products that contain nicotine or tobacco, such as cigarettes and e-cigarettes. If you need help quitting, ask your health care provider.  Avoid secondhand smoke.  Limit your use of caffeine. ? Switch to decaffeinated coffee, tea, and soda. ? Avoid chocolate.  Avoid vibrating tools and machinery. General instructions  Protect your hands and feet from injuries, cuts, or bruises.  Avoid wearing tight rings or wristbands.  Wear loose fitting socks and comfortable, roomy shoes.  Take over-the-counter and prescription medicines only as told by your health care provider. Contact a health care provider if:  Your discomfort becomes worse despite lifestyle changes.  You develop sores on your fingers or toes that do not heal.  Your fingers or toes turn black.  You have breaks in the skin on your fingers or toes.  You have a fever.  You have pain or swelling in your joints.  You have a rash.  Your symptoms occur on only one side of your body. Summary  Raynaud phenomenon is a condition that affects the arteries that carry blood to your fingers, toes, ears, lips, nipples, or the tip of your nose.  In many cases, the cause of this condition is not known.  Symptoms of this condition include changes in skin color, and numbness and tingling of the affected area.  Treatment for this condition includes lifestyle changes, reducing exposure to cold temperatures, and using medicines for severe cases of the condition.  Contact your health care provider if your condition worsens despite treatment. This information is not intended to replace advice given to you by your health care provider. Make sure you  discuss any questions you have with your health care provider. Document Released: 06/03/2000 Document Revised: 06/09/2017 Document Reviewed: 07/18/2016 Elsevier Patient Education  2020 [employer].

## 2019-04-12 DIAGNOSIS — D140 Benign neoplasm of middle ear, nasal cavity and accessory sinuses: Secondary | ICD-10-CM | POA: Diagnosis not present

## 2019-04-20 ENCOUNTER — Encounter (INDEPENDENT_AMBULATORY_CARE_PROVIDER_SITE_OTHER): Payer: Self-pay

## 2019-04-24 ENCOUNTER — Other Ambulatory Visit: Payer: Self-pay

## 2019-04-24 ENCOUNTER — Ambulatory Visit (INDEPENDENT_AMBULATORY_CARE_PROVIDER_SITE_OTHER): Payer: BC Managed Care – PPO | Admitting: Otolaryngology

## 2019-04-24 ENCOUNTER — Encounter (INDEPENDENT_AMBULATORY_CARE_PROVIDER_SITE_OTHER): Payer: Self-pay | Admitting: Otolaryngology

## 2019-04-24 ENCOUNTER — Other Ambulatory Visit (HOSPITAL_COMMUNITY)
Admission: RE | Admit: 2019-04-24 | Discharge: 2019-04-24 | Disposition: A | Discharge status: Home / Self Care | Payer: BC Managed Care – PPO | Source: Ambulatory Visit | Attending: Otolaryngology | Admitting: Otolaryngology

## 2019-04-24 VITALS — Temp 97.8°F

## 2019-04-24 DIAGNOSIS — D385 Neoplasm of uncertain behavior of other respiratory organs: Secondary | ICD-10-CM | POA: Insufficient documentation

## 2019-04-24 DIAGNOSIS — D140 Benign neoplasm of middle ear, nasal cavity and accessory sinuses: Secondary | ICD-10-CM | POA: Diagnosis not present

## 2019-04-24 NOTE — Progress Notes (Signed)
HPI: Veronica Ramirez is a 33 y.o. female who returns today for removal of nasal growths from her right nostril. She denies fever.  Past Medical History:  Diagnosis Date  . History of chickenpox    Past Surgical History:  Procedure Laterality Date  . KNEE ARTHROSCOPY W/ MENISCAL REPAIR Left    Social History   Socioeconomic History  . Marital status: Single    Spouse name: Not on file  . Number of children: Not on file  . Years of education: Not on file  . Highest education level: Not on file  Occupational History  . Not on file  Social Needs  . Financial resource strain: Not on file  . Food insecurity    Worry: Not on file    Inability: Not on file  . Transportation needs    Medical: Not on file    Non-medical: Not on file  Tobacco Use  . Smoking status: Former Smoker    Types: Cigarettes    Quit date: 2010    Years since quitting: 10.8  . Smokeless tobacco: Never Used  Substance and Sexual Activity  . Alcohol use: Yes    Comment: 3-4 glasses of wine  . Drug use: Not Currently  . Sexual activity: Yes    Birth control/protection: I.U.D.  Lifestyle  . Physical activity    Days per week: Not on file    Minutes per session: Not on file  . Stress: Not on file  Relationships  . Social Herbalist on phone: Not on file    Gets together: Not on file    Attends religious service: Not on file    Active member of club or organization: Not on file    Attends meetings of clubs or organizations: Not on file    Relationship status: Not on file  Other Topics Concern  . Not on file  Social History Narrative  . Not on file   Family History  Problem Relation Age of Onset  . Multiple sclerosis Mother   . COPD Father   . Cancer Father        Lung  . Heart attack Father   . Cancer Paternal Aunt        Breast  . Multiple sclerosis Paternal Aunt   . Cancer Paternal Aunt        Breast   Allergies  Allergen Reactions  . Adhesive [Tape] Rash   Prior to  Admission medications   Medication Sig Start Date End Date Taking? Authorizing Provider  ALPRAZolam Duanne Moron) 1 MG tablet Take 1 tablet by mouth daily as needed. 05/21/18   [provider]  amphetamine-dextroamphetamine (ADDERALL) 30 MG tablet Take 1 tablet by mouth 4 (four) times daily.    [provider]  lamoTRIgine (LAMICTAL) 25 MG tablet Take 1 tablet by mouth daily. 04/04/19   [provider]  Melatonin 5 MG CAPS Take 2 capsules by mouth at bedtime.    [provider]  metFORMIN (GLUCOPHAGE) 500 MG tablet TAKE 1 TABLET(500 MG) BY MOUTH TWICE DAILY 01/17/19   Brunetta Jeans, PA-C  [facility] INTRAUTERINE COPPER IUD IUD by Intrauterine route.    [provider]  spironolactone (ALDACTONE) 100 MG tablet TAKE 1 TABLET(100 MG) BY MOUTH DAILY 03/22/19   Brunetta Jeans, PA-C  traZODone (DESYREL) 100 MG tablet Take 1 tablet by mouth at bedtime. 05/03/18   [provider]     Positive ROS: negative  All other systems have been  reviewed and were otherwise negative with the exception of those mentioned in the HPI and as above.  Physical Exam: General: Alert, no acute distress Ears: Ear canals are clear bilaterally with intact, clear TMs Nasal: right nasal cavity with 2 verrucous growths superiorly, anteriorly  Measuring 2-3 mm in size Neck: No palpable adenopathy or masses   Assessment: Nasal growths, uncertain origin  Plan: Procedure performed without complications. Patient tolerated well. She will call to discuss results early next week.   Radene Journey, MD

## 2019-04-24 NOTE — Progress Notes (Signed)
   Procedure Note  Patient: Shacola Cypher             Date of Birth: Oct 26, 1985           MRN: [id number]             Visit Date: 04/24/2019  Procedures: Intranasal biopsy  Date/Time: 04/24/2019 1:06 PM Performed by: Rozetta Nunnery, MD Authorized by: Rozetta Nunnery, MD   Consent:    Consent obtained:  Verbal   Consent given by:  Patient   Risks discussed:  Bleeding and pain   Alternatives discussed:  No treatment Sedation:    Sedation type:  None Anesthesia:    Anesthesia method:  Local infiltration   Local anesthetic:  Lidocaine 1% WITH epi Procedure Details:    Location:  Nose   Nose location: right nare     Nose location comment:  Anterior in nasal cavity Post-procedure details:    Patient tolerance of procedure:  Tolerated well, no immediate complications Comments:     Removed with scissors; bleeding controlled with silver nitrate. Specimen sent to pathology    Plan: Patient will call next week to discuss results. She will call sooner if problems.

## 2019-04-29 LAB — SURGICAL PATHOLOGY

## 2019-04-30 ENCOUNTER — Telehealth (INDEPENDENT_AMBULATORY_CARE_PROVIDER_SITE_OTHER): Payer: Self-pay | Admitting: Otolaryngology

## 2019-04-30 NOTE — Telephone Encounter (Signed)
I called the patient concerning results of her nasal biopsy which showed squamous papilloma.  There was no evidence of malignancy.

## 2019-05-06 DIAGNOSIS — F411 Generalized anxiety disorder: Secondary | ICD-10-CM | POA: Diagnosis not present

## 2019-05-06 DIAGNOSIS — F331 Major depressive disorder, recurrent, moderate: Secondary | ICD-10-CM | POA: Diagnosis not present

## 2019-05-22 ENCOUNTER — Ambulatory Visit: Payer: BC Managed Care – PPO | Admitting: Physician Assistant

## 2019-05-23 ENCOUNTER — Encounter: Payer: Self-pay | Admitting: Physician Assistant

## 2019-05-23 ENCOUNTER — Other Ambulatory Visit (HOSPITAL_COMMUNITY)
Admission: RE | Admit: 2019-05-23 | Discharge: 2019-05-23 | Disposition: A | Discharge status: Home / Self Care | Payer: BC Managed Care – PPO | Source: Ambulatory Visit | Attending: Physician Assistant | Admitting: Physician Assistant

## 2019-05-23 ENCOUNTER — Other Ambulatory Visit: Payer: Self-pay

## 2019-05-23 ENCOUNTER — Ambulatory Visit: Payer: BC Managed Care – PPO | Admitting: Physician Assistant

## 2019-05-23 VITALS — BP 120/70 | HR 95 | Temp 98.7°F | Resp 16 | Ht 69.0 in | Wt 210.0 lb

## 2019-05-23 DIAGNOSIS — B078 Other viral warts: Secondary | ICD-10-CM

## 2019-05-23 DIAGNOSIS — B079 Viral wart, unspecified: Secondary | ICD-10-CM

## 2019-05-23 DIAGNOSIS — N760 Acute vaginitis: Secondary | ICD-10-CM

## 2019-05-23 NOTE — Patient Instructions (Addendum)
For the warts -- keep skin clean and dry. This second treatment should resolve the plantar wart (on foot). The wart on finger may require one additional treatment. If not improved at all we will need to have you see Dermatology for removal.  For the vaginal symptoms -- exam looked good today. Keep hydrated. Avoid sexual activity until we get results back. Can use OTC Monistat for potential yeast while we await your results. IF symptoms remain mild, we can hold off until we get your final results since exam was benign today -- this way we make sure you are getting exactly what you need.

## 2019-05-23 NOTE — Progress Notes (Addendum)
Patient presents to clinic today c/o verrucous vulgaris. Underwent cryotherapy 10/22. Notes wart of distal right finger is similar size but now with smoother surface, less tender. Wart on lateral side of left foot has decreased in size significantly.   Vaginitis -- x 2 days, feels inflamed externally with internal vaginal tenderness. Notes pain with sexual intercourse. Denies new sexual partner. Denies discharge or odor, no abdominal pain, no changes in urinary symptoms, no dysuria. Has significant history of BV and yeast infections, also has history of IC  Past Medical History:  Diagnosis Date  . History of chickenpox     Current Outpatient Medications on File Prior to Visit  Medication Sig Dispense Refill  . ALPRAZolam (XANAX) 1 MG tablet Take 1 tablet by mouth daily as needed.  2  . amphetamine-dextroamphetamine (ADDERALL) 30 MG tablet Take 1 tablet by mouth 4 (four) times daily.    Marland Kitchen lamoTRIgine (LAMICTAL) 25 MG tablet Take 3 tablets by mouth daily.     . Melatonin 5 MG CAPS Take 2 capsules by mouth at bedtime.    . metFORMIN (GLUCOPHAGE) 500 MG tablet TAKE 1 TABLET(500 MG) BY MOUTH TWICE DAILY 60 tablet 5  . PARAGARD INTRAUTERINE COPPER IUD IUD by Intrauterine route.    Marland Kitchen spironolactone (ALDACTONE) 100 MG tablet TAKE 1 TABLET(100 MG) BY MOUTH DAILY 30 tablet 5  . traZODone (DESYREL) 100 MG tablet Take 1 tablet by mouth at bedtime.  0   No current facility-administered medications on file prior to visit.     Allergies  Allergen Reactions  . Adhesive [Tape] Rash    Family History  Problem Relation Age of Onset  . Multiple sclerosis Mother   . COPD Father   . Cancer Father        Lung  . Heart attack Father   . Cancer Paternal Aunt        Breast  . Multiple sclerosis Paternal Aunt   . Cancer Paternal Aunt        Breast    Social History   Socioeconomic History  . Marital status: Single    Spouse name: Not on file  . Number of children: Not on file  . Years of  education: Not on file  . Highest education level: Not on file  Occupational History  . Not on file  Social Needs  . Financial resource strain: Not on file  . Food insecurity    Worry: Not on file    Inability: Not on file  . Transportation needs    Medical: Not on file    Non-medical: Not on file  Tobacco Use  . Smoking status: Former Smoker    Types: Cigarettes    Quit date: 2010    Years since quitting: 10.9  . Smokeless tobacco: Never Used  Substance and Sexual Activity  . Alcohol use: Yes    Comment: 3-4 glasses of wine  . Drug use: Not Currently  . Sexual activity: Yes    Birth control/protection: I.U.D.  Lifestyle  . Physical activity    Days per week: Not on file    Minutes per session: Not on file  . Stress: Not on file  Relationships  . Social Herbalist on phone: Not on file    Gets together: Not on file    Attends religious service: Not on file    Active member of club or organization: Not on file    Attends meetings of clubs or organizations: Not on  file    Relationship status: Not on file  Other Topics Concern  . Not on file  Social History Narrative  . Not on file   Review of Systems - See HPI.  All other ROS are negative.  BP 120/70   Pulse 95   Temp 98.7 F (37.1 C) (Temporal)   Resp 16   Ht 5\' 9"  (1.753 m)   Wt 210 lb (95.3 kg)   SpO2 99%   BMI 31.01 kg/m   Physical Exam Vitals signs reviewed. Exam conducted with a chaperone present.  Constitutional:      Appearance: Normal appearance.  HENT:     Head: Normocephalic and atraumatic.  Neck:     Musculoskeletal: Neck supple.  Pulmonary:     Effort: Pulmonary effort is normal.  Genitourinary:    General: Normal vulva.     Labia:        Right: No rash, tenderness or lesion.        Left: No rash, tenderness or lesion.      Vagina: Normal.     Cervix: No cervical motion tenderness.     Uterus: Normal.      Adnexa: Right adnexa normal and left adnexa normal.  Skin:       Neurological:     Mental Status: She is alert.     Recent Results (from the past 2160 hour(s))  Surgical pathology     Status: None   Collection Time: 04/24/19 12:00 AM  Result Value Ref Range   SURGICAL PATHOLOGY      Surgical Pathology CASE: [id number] PATIENT: Viviana Simpler Surgical Pathology Report     Clinical History: neoplasm of uncertain behavior of vestibule of nose (cm)     FINAL MICROSCOPIC DIAGNOSIS:  A. SKIN, RIGHT NOSTRIL, BIOPSY: - Squamous papilloma.   GROSS DESCRIPTION:  Received in formalin is a 0.4 x 0.2 x 0.2 cm fragment of soft tan-white tissue.  The specimen is submitted in toto.  [facility] 04/26/2019)   Final Diagnosis performed by Enid Cutter, MD.   Electronically signed 04/29/2019 Technical component performed at [facility]. [facility], [place] [address], [place], [place] [address].  Professional component performed at [facility], [place] [address], [place], [place] [address].  Immunohistochemistry Technical component (if applicable) was performed at [facility]. [address] [place], [place], [place] [address].   IMMUNOHISTOCHEMISTRY DISCLAIMER (if applicable): Some of these immunohistochemical stai ns may have been developed and the performance characteristics determine by [facility]. Some may not have been cleared or approved by the U.S. Food and Drug Administration. The FDA has determined that such clearance or approval is not necessary. This test is used for clinical purposes. It should not be regarded as investigational or for research. This laboratory is certified under the [place] (CLIA-88) as qualified to perform high complexity clinical laboratory testing.  The controls stained appropriately.    Assessment/Plan: 1. Verruca Patient verbal consent for procedure was obtained prior to treatment. Cryotherapy applied to 2 lesions --  #1 plantar wart of left lateral foot and #2 distal 2nd phalanx of R hand. Patient tolerated very well without complications. This is 2nd treatment for each lesion. Supportive measures reviewed. If not resolving, especially hand lesion, would refer to Dermatology for potential curettage.   2. Acute vaginitis Unclear etiology. Examination unremarkable. Swab sent for testing. Will treat based on results. Supportive measures reviewed with patient.  - Cervicovaginal ancillary only( [facility])   Leeanne Rio, [place]

## 2019-05-24 LAB — CERVICOVAGINAL ANCILLARY ONLY
Bacterial Vaginitis (gardnerella): NEGATIVE
Candida Glabrata: NEGATIVE
Candida Vaginitis: NEGATIVE
Chlamydia: NEGATIVE
Comment: NEGATIVE
Comment: NEGATIVE
Comment: NEGATIVE
Comment: NEGATIVE
Comment: NEGATIVE
Comment: NORMAL
Neisseria Gonorrhea: NEGATIVE
Trichomonas: NEGATIVE

## 2019-06-03 DIAGNOSIS — F331 Major depressive disorder, recurrent, moderate: Secondary | ICD-10-CM | POA: Diagnosis not present

## 2019-06-03 DIAGNOSIS — F411 Generalized anxiety disorder: Secondary | ICD-10-CM | POA: Diagnosis not present

## 2019-06-07 DIAGNOSIS — R05 Cough: Secondary | ICD-10-CM | POA: Diagnosis not present

## 2019-06-07 DIAGNOSIS — Z20828 Contact with and (suspected) exposure to other viral communicable diseases: Secondary | ICD-10-CM | POA: Diagnosis not present

## 2019-06-07 DIAGNOSIS — B349 Viral infection, unspecified: Secondary | ICD-10-CM | POA: Diagnosis not present

## 2019-06-12 ENCOUNTER — Encounter: Payer: Self-pay | Admitting: Emergency Medicine

## 2019-07-02 DIAGNOSIS — F331 Major depressive disorder, recurrent, moderate: Secondary | ICD-10-CM | POA: Diagnosis not present

## 2019-07-02 DIAGNOSIS — F411 Generalized anxiety disorder: Secondary | ICD-10-CM | POA: Diagnosis not present

## 2019-07-02 DIAGNOSIS — F900 Attention-deficit hyperactivity disorder, predominantly inattentive type: Secondary | ICD-10-CM | POA: Diagnosis not present

## 2019-07-02 DIAGNOSIS — F410 Panic disorder [episodic paroxysmal anxiety] without agoraphobia: Secondary | ICD-10-CM | POA: Diagnosis not present

## 2019-07-16 ENCOUNTER — Other Ambulatory Visit: Payer: Self-pay | Admitting: *Deleted

## 2019-07-16 NOTE — Telephone Encounter (Signed)
LVM to return call, need appointment schedule for DM F/U Last A1C done on 12/11/12019 Pt need OV for refills  Pharmacy requesting Rx  Metformin refill  Last refill on 01/17/2019

## 2019-07-18 ENCOUNTER — Ambulatory Visit (INDEPENDENT_AMBULATORY_CARE_PROVIDER_SITE_OTHER): Payer: Self-pay | Admitting: Physician Assistant

## 2019-07-18 ENCOUNTER — Encounter: Payer: Self-pay | Admitting: Physician Assistant

## 2019-07-18 ENCOUNTER — Other Ambulatory Visit: Payer: Self-pay

## 2019-07-18 DIAGNOSIS — E282 Polycystic ovarian syndrome: Secondary | ICD-10-CM

## 2019-07-18 MED ORDER — METFORMIN HCL 500 MG PO TABS: ORAL_TABLET | ORAL | 5 refills | Status: DC

## 2019-07-18 MED ORDER — SPIRONOLACTONE 100 MG PO TABS: ORAL_TABLET | ORAL | 0 refills | Status: DC

## 2019-07-18 NOTE — Patient Instructions (Addendum)
Instructions sent to MyChart.   Please have your recent labs faxed to [place] at [phone]  Please continue current medication regimen.  Work on exercise goal of 150 minutes per week. Try to get back to a more well-balanced diet.   Once I review labs we can discuss any other changes that need to be made and set up next follow-up.

## 2019-07-18 NOTE — Progress Notes (Signed)
I have discussed the procedure for the virtual visit with the patient who has given consent to proceed with assessment and treatment.   Patient is unable to get any vitals during the visit.  Veronica Ramirez, CMA

## 2019-07-18 NOTE — Progress Notes (Signed)
   Virtual Visit via Video   I connected with patient on 07/18/19 at  8:00 AM EST by a video enabled telemedicine application and verified that I am speaking with the correct person using two identifiers.  Location patient: Home Location provider: [facility], Office Persons participating in the virtual visit: Patient, Provider, [place] (Patina Moore)  I discussed the limitations of evaluation and management by telemedicine and the availability of in person appointments. The patient expressed understanding and agreed to proceed.  Subjective:   HPI:   Patient presents via [facility] today for medication refill. Patient with history of PCOS, currently on spironolactone 100 mg QD and Metformin 500 mg BID. Endorses taking as directed and still tolerating well. Notes diet was previously well-balanced. Has been off since she had COVID the other month. Trying to get back into an exercise regimen. Patient notes having labs recently through work at [employer]. Will send those results to [place] for review. .    ROS:   See pertinent positives and negatives per HPI.  Patient Active Problem List   Diagnosis Date Noted  . Attention deficit hyperactivity disorder (ADHD), predominantly hyperactive type 05/30/2018  . PCOS (polycystic ovarian syndrome) 05/30/2018  . Obesity, Class I, BMI 30-34.9 08/18/2014    Social History   Tobacco Use  . Smoking status: Former Smoker    Types: Cigarettes    Quit date: 2010    Years since quitting: 11.0  . Smokeless tobacco: Never Used  Substance Use Topics  . Alcohol use: Yes    Comment: 3-4 glasses of wine    Current Outpatient Medications:  .  ALPRAZolam (XANAX) 1 MG tablet, Take 1 tablet by mouth daily as needed., Disp: , Rfl: 2 .  amphetamine-dextroamphetamine (ADDERALL) 30 MG tablet, Take 1 tablet by mouth 4 (four) times daily., Disp: , Rfl:  .  lamoTRIgine (LAMICTAL) 25 MG tablet, Take 3 tablets by mouth daily. , Disp: , Rfl:  .  Melatonin 5 MG CAPS,  Take 2 capsules by mouth at bedtime., Disp: , Rfl:  .  metFORMIN (GLUCOPHAGE) 500 MG tablet, TAKE 1 TABLET(500 MG) BY MOUTH TWICE DAILY, Disp: 60 tablet, Rfl: 5 .  PARAGARD INTRAUTERINE COPPER IUD IUD, by Intrauterine route., Disp: , Rfl:  .  spironolactone (ALDACTONE) 100 MG tablet, TAKE 1 TABLET(100 MG) BY MOUTH DAILY, Disp: 30 tablet, Rfl: 5 .  traZODone (DESYREL) 100 MG tablet, Take 1 tablet by mouth at bedtime., Disp: , Rfl: 0  Allergies  Allergen Reactions  . Adhesive [Tape] Rash    Objective:   There were no vitals taken for this visit.  Patient is well-developed, well-nourished in no acute distress.  Resting comfortably at home.  Head is normocephalic, atraumatic.  No labored breathing.  Speech is clear and coherent with logical content.  Patient is alert and oriented at baseline.   Assessment and Plan:   1. PCOS (polycystic ovarian syndrome) Medications refilled. Dietary and exercise regimen reviewed with patient. Patient given fax number to the office to send lab results. Will reviewed and determine need for further assessment/follow-up. - metFORMIN (GLUCOPHAGE) 500 MG tablet; TAKE 1 TABLET(500 MG) BY MOUTH TWICE DAILY  Dispense: 60 tablet; Refill: 5    Leeanne Rio, [place] 07/18/2019

## 2019-09-06 ENCOUNTER — Telehealth (INDEPENDENT_AMBULATORY_CARE_PROVIDER_SITE_OTHER): Payer: BC Managed Care – PPO | Admitting: Physician Assistant

## 2019-09-06 ENCOUNTER — Other Ambulatory Visit: Payer: Self-pay

## 2019-09-06 ENCOUNTER — Encounter: Payer: Self-pay | Admitting: Physician Assistant

## 2019-09-06 DIAGNOSIS — M25562 Pain in left knee: Secondary | ICD-10-CM

## 2019-09-06 DIAGNOSIS — Z87828 Personal history of other (healed) physical injury and trauma: Secondary | ICD-10-CM

## 2019-09-06 MED ORDER — MELOXICAM 15 MG PO TABS: 15.0000 mg | ORAL_TABLET | Freq: Every day | ORAL | 0 refills | Status: DC

## 2019-09-06 NOTE — Progress Notes (Signed)
I have discussed the procedure for the virtual visit with the patient who has given consent to proceed with assessment and treatment.   Savan Ruta S Camisha Srey, CMA     

## 2019-09-06 NOTE — Progress Notes (Signed)
Virtual Visit via Video   I connected with patient on 09/06/19 at  8:30 AM EDT by a video enabled telemedicine application and verified that I am speaking with the correct person using two identifiers.  Location patient: Home Location provider: [facility], Office Persons participating in the virtual visit: Patient, Provider, [place] (Patina Moore)  I discussed the limitations of evaluation and management by telemedicine and the availability of in person appointments. The patient expressed understanding and agreed to proceed.  Subjective:   HPI:   Patient presents to [facility] today complaining of 2 weeks of medial left knee pain starting after she was kneeling in the yard playing with her dog. Noted a slight popping sound in the knee. Denies any pain initially. Noticed her knee started becoming sore the next day. States symptoms are better in the morning and worsened throughout the day with more ambulation. Denies swelling, bruising or redness. Notes the medial joint is quite tender. States she feels like her knee wants to buckle. Patient has history of left meniscal tear status post repair a few years prior. No issues since. Has not taken anything for symptoms.  ROS:   See pertinent positives and negatives per HPI.  Patient Active Problem List   Diagnosis Date Noted  . Attention deficit hyperactivity disorder (ADHD), predominantly hyperactive type 05/30/2018  . PCOS (polycystic ovarian syndrome) 05/30/2018  . Obesity, Class I, BMI 30-34.9 08/18/2014    Social History   Tobacco Use  . Smoking status: Former Smoker    Types: Cigarettes    Quit date: 2010    Years since quitting: 11.2  . Smokeless tobacco: Never Used  Substance Use Topics  . Alcohol use: Yes    Comment: 3-4 glasses of wine    Current Outpatient Medications:  .  ALPRAZolam (XANAX) 1 MG tablet, Take 1 tablet by mouth daily as needed., Disp: , Rfl: 2 .  amphetamine-dextroamphetamine (ADDERALL) 30 MG  tablet, Take 1 tablet by mouth 4 (four) times daily., Disp: , Rfl:  .  lamoTRIgine (LAMICTAL) 25 MG tablet, Take 3 tablets by mouth daily. , Disp: , Rfl:  .  Melatonin 5 MG CAPS, Take 2 capsules by mouth at bedtime., Disp: , Rfl:  .  metFORMIN (GLUCOPHAGE) 500 MG tablet, TAKE 1 TABLET(500 MG) BY MOUTH TWICE DAILY, Disp: 60 tablet, Rfl: 5 .  PARAGARD INTRAUTERINE COPPER IUD IUD, by Intrauterine route., Disp: , Rfl:  .  spironolactone (ALDACTONE) 100 MG tablet, TAKE 1 TABLET(100 MG) BY MOUTH DAILY, Disp: 90 tablet, Rfl: 0 .  traZODone (DESYREL) 100 MG tablet, Take 1 tablet by mouth at bedtime., Disp: , Rfl: 0  Allergies  Allergen Reactions  . Adhesive [Tape] Rash    Objective:   There were no vitals taken for this visit.  Patient is well-developed, well-nourished in no acute distress.  Resting comfortably in her parked car. Head is normocephalic, atraumatic.  No labored breathing.  Speech is clear and coherent with logical content.  Patient is alert and oriented at baseline.   Assessment and Plan:   1. History of meniscal tear 2. Left medial knee pain Atraumatic but ongoing for 2 weeks. Giving her history of discomfort, concern for repeat meniscal injury. We will have her start bracing and elevation. No swelling to indicate need for ice. Discussed topical anti-inflammatory versus heating pad. Rx meloxicam once daily. Tylenol for breakthrough pain. Referral placed to sports medicine for further assessment/management. - meloxicam (MOBIC) 15 MG tablet; Take 1 tablet (15 mg total) by  mouth daily.  Dispense: 15 tablet; Refill: 0 - Ambulatory referral to [place], PA-C 09/06/2019

## 2019-09-06 NOTE — Patient Instructions (Signed)
Instructions sent to MyChart.  Please keep leg elevated while resting. Purchase a knee sleeve from the pharmacy and wear daily as directed. Use the meloxicam once daily with food. Tylenol for breakthrough pain. Can apply topical OTC Voltaren or IcyHot to the area.  You will be contacted for assessment/management by sports medicine. If you do not hear from them by middle of next week, let me know.  Please notify us of any new or worsening symptoms.  Hang in there!

## 2019-09-10 ENCOUNTER — Ambulatory Visit: Payer: BC Managed Care – PPO | Admitting: Family Medicine

## 2019-09-10 ENCOUNTER — Ambulatory Visit: Payer: Self-pay

## 2019-09-10 ENCOUNTER — Encounter: Payer: Self-pay | Admitting: Family Medicine

## 2019-09-10 ENCOUNTER — Ambulatory Visit (INDEPENDENT_AMBULATORY_CARE_PROVIDER_SITE_OTHER): Payer: BC Managed Care – PPO

## 2019-09-10 ENCOUNTER — Other Ambulatory Visit: Payer: Self-pay

## 2019-09-10 VITALS — BP 140/90 | HR 77 | Ht 69.0 in | Wt 195.2 lb

## 2019-09-10 DIAGNOSIS — M25562 Pain in left knee: Secondary | ICD-10-CM

## 2019-09-10 DIAGNOSIS — M25462 Effusion, left knee: Secondary | ICD-10-CM | POA: Diagnosis not present

## 2019-09-10 NOTE — Progress Notes (Signed)
Subjective:    I'm seeing this patient as a consultation for:  Elyn Aquas, PA-C. Note will be routed back to referring provider/PCP.  CC: L knee pain  I, Molly Weber, LAT, ATC, am serving as scribe for Dr. Lynne Leader.  HPI: Pt is a 34 y/o female presenting w/ c/o L knee pain x 2 weeks after kneeling in her yard playing w/ her dog.  Pt has a hx of a L knee meniscal repar or debridement  in 2007/2008 performed in [place].  She rates her pain at a 5/10 and describes her pain as sharp.  Radiating pain: No L knee swelling: No L knee mechanical symptoms:Yes Aggravating factors: Walking; getting in/out of the car; stairs; rotation/twisting Treatments tried: Tylenol; knee brace; heat and ice  Past medical history, Surgical history, Family history, Social history, Allergies, and medications have been entered into the medical record, reviewed.   Review of Systems: No new headache, visual changes, nausea, vomiting, diarrhea, constipation, dizziness, abdominal pain, skin rash, fevers, chills, night sweats, weight loss, swollen lymph nodes, body aches, joint swelling, muscle aches, chest pain, shortness of breath, mood changes, visual or auditory hallucinations.   Objective:    Vitals:   09/10/19 1555  BP: 140/90  Pulse: 77  SpO2: 99%   General: Well Developed, well nourished, and in no acute distress.  Neuro/Psych: Alert and oriented x3, extra-ocular muscles intact, able to move all 4 extremities, sensation grossly intact. Skin: Warm and dry, no rashes noted.  Respiratory: Not using accessory muscles, speaking in full sentences, trachea midline.  Cardiovascular: Pulses palpable, no extremity edema. Abdomen: Does not appear distended. MSK: Left knee moderate effusion otherwise normal-appearing Range of motion 0-120 degrees without significant crepitation. Tender palpation medial lateral joint line. Stable ligamentous exam pain with MCL stress test without laxity. Positive medial and lateral  McMurray's test. Intact flexion and extension strength  Lab and Radiology Results   X-ray images left knee obtained today personally independent reviewed. Mild degenerative changes no acute fractures. Await formal radiology review  Diagnostic Limited MSK Ultrasound of: Left knee Quad tendon intact.  Moderate joint effusion present. Patellar tendon intact normal-appearing Medial meniscus degenerative appearing portion of the anterior aspect of the meniscus. Lateral meniscus degenerative appearing portion of it appears to be missing. Mild degenerative changes. No Baker's cyst. Impression: DJD meniscus tear and status post partial meniscectomy  Procedure: Real-time Ultrasound Guided Injection of left knee Device: Philips Affiniti 50G Images permanently stored and available for review in the ultrasound unit. Verbal informed consent obtained.  Discussed risks and benefits of procedure. Warned about infection bleeding damage to structures skin hypopigmentation and fat atrophy among others. Patient expresses understanding and agreement Time-out conducted.   Noted no overlying erythema, induration, or other signs of local infection.   Skin prepped in a sterile fashion.   Local anesthesia: Topical Ethyl chloride.   With sterile technique and under real time ultrasound guidance:  40 mg of Kenalog and 3 mL of Marcaine injected easily.   Completed without difficulty   Pain immediately resolved suggesting accurate placement of the medication.   Advised to call if fevers/chills, erythema, induration, drainage, or persistent bleeding.   Images permanently stored and available for review in the ultrasound unit.  Impression: Technically successful ultrasound guided injection.        Impression and Recommendations:    Assessment and Plan: 34 y.o. female with left knee pain.  Probable new meniscus injury.  Fortunately patient does not have severe mechanical symptoms  at this time.  Plan for  injection as above along with Voltaren gel and compressive knee sleeve.  If not improving next step will be MRI likely.  Patient will keep me informed.  Check back as needed.Marland Kitchen  PDMP not reviewed this encounter. Orders Placed This Encounter  Procedures  . DG Knee Complete 4 Views Left    Standing Status:   Future    Number of Occurrences:   1    Standing Expiration Date:   11/09/2020    Order Specific Question:   Reason for Exam (SYMPTOM  OR DIAGNOSIS REQUIRED)    Answer:   Left knee pain    Order Specific Question:   Is patient pregnant?    Answer:   No    Order Specific Question:   Preferred imaging location?    Answer:   [facility]    Order Specific Question:   Radiology Contrast Protocol - do NOT remove file path    Answer:   \\[email or web address].pdf  . [place] LIMITED JOINT SPACE STRUCTURES LOW LEFT(NO LINKED CHARGES)    Order Specific Question:   Reason for Exam (SYMPTOM  OR DIAGNOSIS REQUIRED)    Answer:   L knee pain    Order Specific Question:   Preferred imaging location?    Answer:   [place]   No orders of the defined types were placed in this encounter.   Discussed warning signs or symptoms. Please see discharge instructions. Patient expresses understanding.   The above documentation has been reviewed and is accurate and complete Lynne Leader

## 2019-09-10 NOTE — Patient Instructions (Signed)
Thank you for coming in today. Call or go to the ER if you develop a large red swollen joint with extreme pain or oozing puss.  Ok to continue tylneol or ibuprofen or aleve.  Add voltaren gel over the counter up to 4x daily.  Recommend a compression knee sleeve.  Body Helix Full Knee Sleeve.  Keep me updated.  Recheck if not improving.    Meniscus Tear  A meniscus tear is a knee injury that happens when a piece of the meniscus is torn. The meniscus is a thick, rubbery, wedge-shaped cartilage in the knee. Two menisci are located in each knee. They sit between the upper bone (femur) and lower bone (tibia) that make up the knee joint. Each meniscus acts as a shock absorber for the knee. A torn meniscus is one of the most common types of knee injuries. This injury can range from mild to severe. Surgery may be needed to repair a severe tear. What are the causes? This condition may be caused by any kneeling, squatting, twisting, or pivoting movement. Sports-related injuries are the most common cause. These often occur from:  Running and stopping suddenly. ? Changing direction. ? Being tackled or knocked off your feet.  Lifting or carrying heavy weights. As people get older, their menisci get thinner and weaker. In these people, tears can happen more easily, such as from climbing stairs. What increases the risk? You are more likely to develop this condition if you:  Play contact sports.  Have a job that requires kneeling or squatting.  Are female.  Are over 69 years old. What are the signs or symptoms? Symptoms of this condition include:  Knee pain, especially at the side of the knee joint. You may feel pain when the injury occurs, or you may only hear a pop and feel pain later.  A feeling that your knee is clicking, catching, locking, or giving way.  Not being able to fully bend or extend your knee.  Bruising or swelling in your knee. How is this diagnosed? This condition may be  diagnosed based on your symptoms and a physical exam. You may also have tests, such as:  X-rays.  MRI.  A procedure to look inside your knee with a narrow surgical telescope (arthroscopy). You may be referred to a knee specialist (orthopedic surgeon). How is this treated? Treatment for this injury depends on the severity of the tear. Treatment for a mild tear may include:  Rest.  Medicine to reduce pain and swelling. This is usually a nonsteroidal anti-inflammatory drug (NSAID), like ibuprofen.  A knee brace, sleeve, or wrap.  Using crutches or a walker to keep weight off your knee and to help you walk.  Exercises to strengthen your knee (physical therapy). You may need surgery if you have a severe tear or if other treatments are not working. Follow these instructions at home: If you have a brace, sleeve, or wrap:  Wear it as told by your health care provider. Remove it only as told by your health care provider.  Loosen the brace, sleeve, or wrap if your toes tingle, become numb, or turn cold and blue.  Keep the brace, sleeve, or wrap clean and dry.  If the brace, sleeve, or wrap is not waterproof: ? Do not let it get wet. ? Cover it with a watertight covering when you take a bath or shower. Managing pain and swelling   Take over-the-counter and prescription medicines only as told by your health care provider.  If directed, put ice on your knee: ? If you have a removable brace, sleeve, or wrap, remove it as told by your health care provider. ? Put ice in a plastic bag. ? Place a towel between your skin and the bag. ? Leave the ice on for 20 minutes, 2-3 times per day.  Move your toes often to avoid stiffness and to lessen swelling.  Raise (elevate) the injured area above the level of your heart while you are sitting or lying down. Activity  Do not use the injured limb to support your body weight until your health care provider says that you can. Use crutches or a  walker as told by your health care provider.  Return to your normal activities as told by your health care provider. Ask your health care provider what activities are safe for you.  Perform range-of-motion exercises only as told by your health care provider.  Begin doing exercises to strengthen your knee and leg muscles only as told by your health care provider. After you recover, your health care provider may recommend these exercises to help prevent another injury. General instructions  Use a knee brace, sleeve, or wrap as told by your health care provider.  Ask your health care provider when it is safe to drive if you have a brace, sleeve, or wrap on your knee.  Do not use any products that contain nicotine or tobacco, such as cigarettes, e-cigarettes, and chewing tobacco. If you need help quitting, ask your health care provider.  Ask your health care provider if the medicine prescribed to you: ? Requires you to avoid driving or using heavy machinery. ? Can cause constipation. You may need to take these actions to prevent or treat constipation:  Drink enough fluid to keep your urine pale yellow.  Take over-the-counter or prescription medicines.  Eat foods that are high in fiber, such as beans, whole grains, and fresh fruits and vegetables.  Limit foods that are high in fat and processed sugars, such as fried or sweet foods.  Keep all follow-up visits as told by your health care provider. This is important. Contact a health care provider if:  You have a fever.  Your knee becomes red, tender, or swollen.  Your pain medicine is not helping.  Your symptoms get worse or do not improve after 2 weeks of home care. Summary  A meniscus tear is a knee injury that happens when a piece of the meniscus is torn.  Treatment for this injury depends on the severity of the tear. You may need surgery if you have a severe tear or if other treatments are not working.  Rest, ice, and raise  (elevate) your injured knee as told by your health care provider. This will help lessen pain and swelling.  Contact a health care provider if you have new symptoms, or your symptoms get worse or do not improve after 2 weeks of home care.  Keep all follow-up visits as told by your health care provider. This is important. This information is not intended to replace advice given to you by your health care provider. Make sure you discuss any questions you have with your health care provider. Document Revised: 12/19/2017 Document Reviewed: 12/19/2017 Elsevier Patient Education  [employer].

## 2019-09-11 ENCOUNTER — Encounter: Payer: Self-pay | Admitting: Family Medicine

## 2019-09-11 NOTE — Progress Notes (Signed)
X-ray images left knee show small joint effusion no fracture or significant arthritis

## 2019-09-17 DIAGNOSIS — F900 Attention-deficit hyperactivity disorder, predominantly inattentive type: Secondary | ICD-10-CM | POA: Diagnosis not present

## 2019-09-17 DIAGNOSIS — F331 Major depressive disorder, recurrent, moderate: Secondary | ICD-10-CM | POA: Diagnosis not present

## 2019-09-17 DIAGNOSIS — F411 Generalized anxiety disorder: Secondary | ICD-10-CM | POA: Diagnosis not present

## 2019-09-17 DIAGNOSIS — F410 Panic disorder [episodic paroxysmal anxiety] without agoraphobia: Secondary | ICD-10-CM | POA: Diagnosis not present

## 2019-09-24 ENCOUNTER — Ambulatory Visit: Payer: Self-pay | Admitting: Physician Assistant

## 2019-11-25 ENCOUNTER — Ambulatory Visit: Payer: BC Managed Care – PPO | Admitting: Family Medicine

## 2019-11-25 DIAGNOSIS — F411 Generalized anxiety disorder: Secondary | ICD-10-CM | POA: Diagnosis not present

## 2019-11-25 DIAGNOSIS — F900 Attention-deficit hyperactivity disorder, predominantly inattentive type: Secondary | ICD-10-CM | POA: Diagnosis not present

## 2019-11-25 DIAGNOSIS — F410 Panic disorder [episodic paroxysmal anxiety] without agoraphobia: Secondary | ICD-10-CM | POA: Diagnosis not present

## 2019-11-25 DIAGNOSIS — F331 Major depressive disorder, recurrent, moderate: Secondary | ICD-10-CM | POA: Diagnosis not present

## 2019-11-25 NOTE — Progress Notes (Deleted)
   I, Veronica Ramirez, LAT, ATC, am serving as scribe for Dr. Lynne Ramirez.  Veronica Ramirez is a 34 y.o. female who presents to [place] at [facility] today for f/u of L knee pain.  She was last seen by Dr. Georgina Ramirez on 09/10/19 after injuring her L knee while kneeling in the yard playing w/ her dog.  She had a L knee injection and was advised to use a knee compression sleeve.  Pt has a hx of prior knee meniscus surgery in 2007/08.  Since her last visit, pt reports  Diagnostic testing: L knee XR- 09/10/19   Pertinent review of systems: ***  Relevant historical information: ***   Exam:  There were no vitals taken for this visit. General: Well Developed, well nourished, and in no acute distress.   MSK: ***    Lab and Radiology Results No results found for this or any previous visit (from the past 72 hour(s)). No results found.     Assessment and Plan: 34 y.o. female with ***   PDMP not reviewed this encounter. No orders of the defined types were placed in this encounter.  No orders of the defined types were placed in this encounter.    Discussed warning signs or symptoms. Please see discharge instructions. Patient expresses understanding.   ***

## 2019-11-26 ENCOUNTER — Ambulatory Visit: Payer: Self-pay

## 2019-11-26 ENCOUNTER — Ambulatory Visit: Payer: BC Managed Care – PPO | Admitting: Family Medicine

## 2019-11-26 ENCOUNTER — Other Ambulatory Visit: Payer: Self-pay

## 2019-11-26 ENCOUNTER — Encounter: Payer: Self-pay | Admitting: Family Medicine

## 2019-11-26 VITALS — BP 120/86 | HR 90 | Ht 69.0 in | Wt 188.2 lb

## 2019-11-26 DIAGNOSIS — M25562 Pain in left knee: Secondary | ICD-10-CM

## 2019-11-26 MED ORDER — MELOXICAM 15 MG PO TABS: 15.0000 mg | ORAL_TABLET | Freq: Every day | ORAL | 0 refills | Status: DC | PRN

## 2019-11-26 NOTE — Progress Notes (Signed)
I, Wendy Poet, LAT, ATC, am serving as scribe for Dr. Lynne Leader.  Veronica Ramirez is a 34 y.o. female who presents to [place] at [facility] today for f/u of L knee pain that began after kneeling in her yard and playing w/ her dog on 09/10/19.  She has a hx of L knee meniscal surgery in 2007-08 while living in [place].  She was last seen by Dr. Georgina Snell on 09/10/19 and had a L knee injection and was advised to purchase a L knee compression sleeve.  Since her last visit, pt reports that her L knee felt better after the injection until 2 weeks ago when her L knee pain returned and is worsening.  She is now having more mechanical symptoms and is having more consistent sharp pain.  She also feels like she is having issues w/ L knee instability.    Diagnostic imaging: L knee XR- 09/10/19  Pertinent review of systems: No fevers or chills  Relevant historical information: ADHD   Exam:  BP 120/86 (BP Location: Right Arm, Patient Position: Sitting, Cuff Size: Normal)   Pulse 90   Ht 5\' 9"  (1.753 m)   Wt 188 lb 3.2 oz (85.4 kg)   SpO2 98%   BMI 27.79 kg/m  General: Well Developed, well nourished, and in no acute distress.   MSK: Left knee normal-appearing with no effusion. Range of motion 0-120 degrees with crepitation. Tender palpation medial lateral joint line. Stable ligamentous exam. Positive McMurray test.    Lab and Radiology Results EXAM: LEFT KNEE - COMPLETE 4+ VIEW  COMPARISON:  None.  FINDINGS: No evidence of fracture. Small to moderate knee joint effusion. No evidence of arthropathy or other focal bone abnormality. Soft tissues are unremarkable.  IMPRESSION: Small-to-moderate knee joint effusion, nonspecific. No fracture or significant arthropathy.   Electronically Signed   By: Davina Poke D.O.   On: 09/11/2019 09:17 I, Lynne Leader, personally (independently) visualized and performed the interpretation of the images attached in this  note.     Assessment and Plan: 34 y.o. female with left knee pain due to probable meniscus tear.  Failing conservative management now with significant mechanical symptoms.  Plan for MRI to further characterize cause of pain and for surgical planning.  Meloxicam for limited pain control in the interim.  Recheck back if needed sooner.  Otherwise check back after MRI.   PDMP not reviewed this encounter. Orders Placed This Encounter  Procedures  . MR Knee Left  Wo Contrast    Standing Status:   Future    Standing Expiration Date:   11/25/2020    Order Specific Question:   What is the patient's sedation requirement?    Answer:   No Sedation    Order Specific Question:   Does the patient have a pacemaker or implanted devices?    Answer:   No    Order Specific Question:   Preferred imaging location?    Answer:   [facility] (table limit-350lbs)    Order Specific Question:   Radiology Contrast Protocol - do NOT remove file path    Answer:   \\[email or web address]   Meds ordered this encounter  Medications  . meloxicam (MOBIC) 15 MG tablet    Sig: Take 1 tablet (15 mg total) by mouth daily as needed for pain.    Dispense:  30 tablet    Refill:  0     Discussed warning signs or symptoms. Please see discharge instructions. Patient expresses understanding.  The above documentation has been reviewed and is accurate and complete Lynne Leader, M.D.

## 2019-11-26 NOTE — Patient Instructions (Signed)
Thank you for coming in today. Plan for meloxicam daily as needed for pain.  Plan for MRI.  You should hear soon about scheduling.  Schedule follow up with me a day or 2 after the MRI.  Let me know if you do not hear about scheduling.  [facility] Radiology [phone]

## 2019-12-01 ENCOUNTER — Ambulatory Visit (INDEPENDENT_AMBULATORY_CARE_PROVIDER_SITE_OTHER): Payer: BC Managed Care – PPO

## 2019-12-01 ENCOUNTER — Other Ambulatory Visit: Payer: Self-pay

## 2019-12-01 DIAGNOSIS — M25562 Pain in left knee: Secondary | ICD-10-CM | POA: Diagnosis not present

## 2019-12-02 ENCOUNTER — Encounter: Payer: Self-pay | Admitting: Family Medicine

## 2019-12-02 DIAGNOSIS — S83242A Other tear of medial meniscus, current injury, left knee, initial encounter: Secondary | ICD-10-CM

## 2019-12-02 NOTE — Progress Notes (Signed)
MRI shows a small tear of the lateral meniscus and a small tear of the medial meniscus with a para meniscal cyst.  If you are failing conservative management at this point the next step is probably surgery.  I can refer directly to orthopedic surgery if you would like or we can schedule a face-to-face follow-up visit to review the MRI further and discuss options.  Would you like me to refer directly to orthopedic surgery?

## 2019-12-05 DIAGNOSIS — M25562 Pain in left knee: Secondary | ICD-10-CM | POA: Diagnosis not present

## 2019-12-12 DIAGNOSIS — Y999 Unspecified external cause status: Secondary | ICD-10-CM | POA: Diagnosis not present

## 2019-12-12 DIAGNOSIS — S83272A Complex tear of lateral meniscus, current injury, left knee, initial encounter: Secondary | ICD-10-CM | POA: Diagnosis not present

## 2019-12-12 DIAGNOSIS — S83232A Complex tear of medial meniscus, current injury, left knee, initial encounter: Secondary | ICD-10-CM | POA: Diagnosis not present

## 2019-12-12 DIAGNOSIS — X58XXXA Exposure to other specified factors, initial encounter: Secondary | ICD-10-CM | POA: Diagnosis not present

## 2019-12-12 DIAGNOSIS — G8918 Other acute postprocedural pain: Secondary | ICD-10-CM | POA: Diagnosis not present

## 2019-12-12 DIAGNOSIS — M2242 Chondromalacia patellae, left knee: Secondary | ICD-10-CM | POA: Diagnosis not present

## 2019-12-12 HISTORY — PX: KNEE ARTHROSCOPY W/ MENISCAL REPAIR: SHX1877

## 2020-01-01 ENCOUNTER — Other Ambulatory Visit: Payer: Self-pay | Admitting: Physician Assistant

## 2020-01-01 ENCOUNTER — Encounter: Payer: Self-pay | Admitting: Emergency Medicine

## 2020-01-29 ENCOUNTER — Other Ambulatory Visit: Payer: Self-pay

## 2020-01-29 ENCOUNTER — Other Ambulatory Visit (HOSPITAL_COMMUNITY)
Admission: RE | Admit: 2020-01-29 | Discharge: 2020-01-29 | Disposition: A | Discharge status: Home / Self Care | Payer: BC Managed Care – PPO | Source: Ambulatory Visit | Attending: Physician Assistant | Admitting: Physician Assistant

## 2020-01-29 ENCOUNTER — Encounter: Payer: Self-pay | Admitting: Physician Assistant

## 2020-01-29 ENCOUNTER — Ambulatory Visit (INDEPENDENT_AMBULATORY_CARE_PROVIDER_SITE_OTHER): Payer: BC Managed Care – PPO | Admitting: Physician Assistant

## 2020-01-29 VITALS — BP 120/78 | HR 83 | Temp 98.5°F | Resp 16 | Ht 68.5 in | Wt 188.0 lb

## 2020-01-29 DIAGNOSIS — E282 Polycystic ovarian syndrome: Secondary | ICD-10-CM | POA: Diagnosis not present

## 2020-01-29 DIAGNOSIS — Z0001 Encounter for general adult medical examination with abnormal findings: Secondary | ICD-10-CM | POA: Diagnosis not present

## 2020-01-29 DIAGNOSIS — L750 Bromhidrosis: Secondary | ICD-10-CM

## 2020-01-29 DIAGNOSIS — Z0000 Encounter for general adult medical examination without abnormal findings: Secondary | ICD-10-CM

## 2020-01-29 DIAGNOSIS — N898 Other specified noninflammatory disorders of vagina: Secondary | ICD-10-CM | POA: Insufficient documentation

## 2020-01-29 DIAGNOSIS — R6889 Other general symptoms and signs: Secondary | ICD-10-CM

## 2020-01-29 LAB — CBC WITH DIFFERENTIAL/PLATELET
Basophils Absolute: 0 10*3/uL (ref 0.0–0.1)
Basophils Relative: 0.4 % (ref 0.0–3.0)
Eosinophils Absolute: 0.1 10*3/uL (ref 0.0–0.7)
Eosinophils Relative: 1.5 % (ref 0.0–5.0)
HCT: 41.4 % (ref 36.0–46.0)
Hemoglobin: 14 g/dL (ref 12.0–15.0)
Lymphocytes Relative: 26.8 % (ref 12.0–46.0)
Lymphs Abs: 1.4 10*3/uL (ref 0.7–4.0)
MCHC: 33.8 g/dL (ref 30.0–36.0)
MCV: 94.4 fl (ref 78.0–100.0)
Monocytes Absolute: 0.4 10*3/uL (ref 0.1–1.0)
Monocytes Relative: 7.4 % (ref 3.0–12.0)
Neutro Abs: 3.5 10*3/uL (ref 1.4–7.7)
Neutrophils Relative %: 63.9 % (ref 43.0–77.0)
Platelets: 299 10*3/uL (ref 150.0–400.0)
RBC: 4.39 Mil/uL (ref 3.87–5.11)
RDW: 12.6 % (ref 11.5–15.5)
WBC: 5.4 10*3/uL (ref 4.0–10.5)

## 2020-01-29 LAB — LIPID PANEL
Cholesterol: 170 mg/dL (ref 0–200)
HDL: 63.7 mg/dL (ref >39.00)
LDL Cholesterol: 97 mg/dL (ref 0–99)
NonHDL: 106.39
Total CHOL/HDL Ratio: 3
Triglycerides: 49 mg/dL (ref 0.0–149.0)
VLDL: 9.8 mg/dL (ref 0.0–40.0)

## 2020-01-29 LAB — COMPREHENSIVE METABOLIC PANEL
ALT: 10 U/L (ref 0–35)
AST: 12 U/L (ref 0–37)
Albumin: 4.3 g/dL (ref 3.5–5.2)
Alkaline Phosphatase: 59 U/L (ref 39–117)
BUN: 14 mg/dL (ref 6–23)
CO2: 24 mEq/L (ref 19–32)
Calcium: 9.6 mg/dL (ref 8.4–10.5)
Chloride: 101 mEq/L (ref 96–112)
Creatinine, Ser: 1.01 mg/dL (ref 0.40–1.20)
GFR: 61.92 mL/min (ref >60.00)
Glucose, Bld: 85 mg/dL (ref 70–99)
Potassium: 4.2 mEq/L (ref 3.5–5.1)
Sodium: 137 mEq/L (ref 135–145)
Total Bilirubin: 0.5 mg/dL (ref 0.2–1.2)
Total Protein: 6.4 g/dL (ref 6.0–8.3)

## 2020-01-29 LAB — HEMOGLOBIN A1C: Hgb A1c MFr Bld: 4.9 % (ref 4.6–6.5)

## 2020-01-29 LAB — TSH: TSH: 1.62 u[IU]/mL (ref 0.35–4.50)

## 2020-01-29 MED ORDER — SPIRONOLACTONE 100 MG PO TABS: ORAL_TABLET | ORAL | 1 refills | Status: DC

## 2020-01-29 MED ORDER — METFORMIN HCL 500 MG PO TABS: ORAL_TABLET | ORAL | 5 refills | Status: DC

## 2020-01-29 NOTE — Progress Notes (Signed)
Patient presents to clinic today for annual exam.  Patient is fasting for labs.  Patient endorses noting an issue with increased body odor since she had COVID last winter. Denies any increased sweating. Has tried different soaps, lotions, etc to help with this. Keeps well-hydrated and has avoided pungent smells.   Would also like urinary testing as she feels she may have a recurrence of bacterial vaginosis due to vaginal odor. Denies discharge. Denies concern for STI.   Health Maintenance: Immunizations -- up-to-date.  PAP -- up-to-date  Past Medical History:  Diagnosis Date  . History of chickenpox     Past Surgical History:  Procedure Laterality Date  . KNEE ARTHROSCOPY W/ MENISCAL REPAIR Left 12/12/2019    Current Outpatient Medications on File Prior to Visit  Medication Sig Dispense Refill  . ALPRAZolam (XANAX) 1 MG tablet Take 1 tablet by mouth daily as needed.  2  . amphetamine-dextroamphetamine (ADDERALL) 30 MG tablet Take 1 tablet by mouth 4 (four) times daily.    Marland Kitchen lamoTRIgine (LAMICTAL) 100 MG tablet Take 100 mg by mouth daily.    Marland Kitchen lamoTRIgine (LAMICTAL) 25 MG tablet Take 25 mg by mouth daily.     . Melatonin 5 MG CAPS Take 2 capsules by mouth at bedtime.    . metFORMIN (GLUCOPHAGE) 500 MG tablet TAKE 1 TABLET(500 MG) BY MOUTH TWICE DAILY 60 tablet 5  . PARAGARD INTRAUTERINE COPPER IUD IUD by Intrauterine route.    Marland Kitchen spironolactone (ALDACTONE) 100 MG tablet TAKE 1 TABLET(100 MG) BY MOUTH DAILY. PLEASE CALL TO SCHEDULE PHYSICAL 30 tablet 0  . traZODone (DESYREL) 100 MG tablet Take 1 tablet by mouth at bedtime.  0   No current facility-administered medications on file prior to visit.    Allergies  Allergen Reactions  . Adhesive [Tape] Rash    Family History  Problem Relation Age of Onset  . Multiple sclerosis Mother   . COPD Father   . Cancer Father        Lung  . Heart attack Father   . Cancer Paternal Aunt        Breast  . Multiple sclerosis Paternal  Aunt   . Cancer Paternal Aunt        Breast    Social History   Socioeconomic History  . Marital status: Single    Spouse name: Not on file  . Number of children: Not on file  . Years of education: Not on file  . Highest education level: Not on file  Occupational History  . Not on file  Tobacco Use  . Smoking status: Former Smoker    Types: Cigarettes    Quit date: 2010    Years since quitting: 11.6  . Smokeless tobacco: Never Used  Vaping Use  . Vaping Use: Never used  Substance and Sexual Activity  . Alcohol use: Yes    Comment: 3-4 glasses of wine  . Drug use: Not Currently  . Sexual activity: Yes    Birth control/protection: I.U.D.  Other Topics Concern  . Not on file  Social History Narrative  . Not on file   Social Determinants of Health   Financial Resource Strain:   . Difficulty of Paying Living Expenses:   Food Insecurity:   . Worried About Charity fundraiser in the Last Year:   . Arboriculturist in the Last Year:   Transportation Needs:   . Film/video editor (Medical):   Marland Kitchen Lack of Transportation (Non-Medical):  Physical Activity:   . Days of Exercise per Week:   . Minutes of Exercise per Session:   Stress:   . Feeling of Stress :   Social Connections:   . Frequency of Communication with Friends and Family:   . Frequency of Social Gatherings with Friends and Family:   . Attends Religious Services:   . Active Member of Clubs or Organizations:   . Attends Archivist Meetings:   Marland Kitchen Marital Status:   Intimate Partner Violence:   . Fear of Current or Ex-Partner:   . Emotionally Abused:   Marland Kitchen Physically Abused:   . Sexually Abused:    Review of Systems  Constitutional: Negative for fever and weight loss.  HENT: Negative for ear discharge, ear pain, hearing loss and tinnitus.   Eyes: Negative for blurred vision, double vision, photophobia and pain.  Respiratory: Negative for cough and shortness of breath.   Cardiovascular: Negative  for chest pain and palpitations.  Gastrointestinal: Negative for abdominal pain, blood in stool, constipation, diarrhea, heartburn, melena, nausea and vomiting.  Genitourinary: Negative for dysuria, flank pain, frequency, hematuria and urgency.  Musculoskeletal: Negative for falls.  Neurological: Negative for dizziness, loss of consciousness and headaches.  Endo/Heme/Allergies: Negative for environmental allergies.  Psychiatric/Behavioral: Negative for depression, hallucinations, substance abuse and suicidal ideas. The patient is not nervous/anxious and does not have insomnia.    BP 120/78   Pulse 83   Temp 98.5 F (36.9 C) (Temporal)   Resp 16   Ht 5' 8.5" (1.74 m)   Wt 188 lb (85.3 kg)   SpO2 99%   BMI 28.17 kg/m   Physical Exam Vitals reviewed.  Constitutional:      Appearance: Normal appearance.  HENT:     Head: Normocephalic and atraumatic.     Right Ear: Tympanic membrane, ear canal and external ear normal.     Left Ear: Tympanic membrane, ear canal and external ear normal.     Nose: Nose normal. No mucosal edema.     Mouth/Throat:     Pharynx: Uvula midline. No oropharyngeal exudate or posterior oropharyngeal erythema.  Eyes:     Conjunctiva/sclera: Conjunctivae normal.     Pupils: Pupils are equal, round, and reactive to light.  Neck:     Thyroid: No thyromegaly.  Cardiovascular:     Rate and Rhythm: Normal rate and regular rhythm.     Pulses: Normal pulses.     Heart sounds: Normal heart sounds.  Pulmonary:     Effort: Pulmonary effort is normal. No respiratory distress.     Breath sounds: Normal breath sounds. No wheezing or rales.  Abdominal:     General: Bowel sounds are normal. There is no distension.     Palpations: Abdomen is soft. There is no mass.     Tenderness: There is no abdominal tenderness. There is no guarding or rebound.  Musculoskeletal:     Cervical back: Neck supple.  Lymphadenopathy:     Cervical: No cervical adenopathy.  Skin:     General: Skin is warm and dry.     Findings: No rash.  Neurological:     General: No focal deficit present.     Mental Status: She is alert and oriented to person, place, and time.     Cranial Nerves: No cranial nerve deficit.  Psychiatric:        Mood and Affect: Mood normal.    Assessment/Plan: 1. Visit for preventive health examination Depression screen negative. Health Maintenance reviewed. Preventive  schedule discussed and handout given in AVS. Will obtain fasting labs today.  - CBC with Differential/Platelet - Comprehensive metabolic panel - Hemoglobin A1c - Lipid panel - TSH  2. PCOS (polycystic ovarian syndrome) Repeat labs today. BP stable. Will plan on continuing Metformin and Spironolactone. Will alter if indicated based on results.  - metFORMIN (GLUCOPHAGE) 500 MG tablet; TAKE 1 TABLET(500 MG) BY MOUTH TWICE DAILY  Dispense: 60 tablet; Refill: 5 - Hemoglobin A1c - Lipid panel - TSH  3. Vaginal pruritus With odor. Will have her start LUME deodorant and daily probiotic. Will check urine for yeast/BV.  - Urine cytology ancillary only([place])  4. Abnormal body odor Subjective. Not appreciated on exam today. No hyperhidrosis. Will have her start probiotic and Lume deodorant. If not improving would have her have assessment with Dermatology.   This visit occurred during the SARS-CoV-2 public health emergency.  Safety protocols were in place, including screening questions prior to the visit, additional usage of staff PPE, and extensive cleaning of exam room while observing appropriate contact time as indicated for disinfecting solutions.    Leeanne Rio, PA-C

## 2020-01-29 NOTE — Patient Instructions (Signed)
Please go to the lab for blood work.   Our office will call you with your results unless you have chosen to receive results via MyChart.  If your blood work is normal we will follow-up each year for physicals and as scheduled for chronic medical problems.  If anything is abnormal we will treat accordingly and get you in for a follow-up.  Please continue chronic medications as directed. Start a daily probiotic. Look into the Lume deoderant. You can get at a lot of stores now but you can get off of [email or web address] as well. I have had a lot of people have great success with this.    Preventive Care 33-7 Years Old, Female Preventive care refers to visits with your health care provider and lifestyle choices that can promote health and wellness. This includes:  A yearly physical exam. This may also be called an annual well check.  Regular dental visits and eye exams.  Immunizations.  Screening for certain conditions.  Healthy lifestyle choices, such as eating a healthy diet, getting regular exercise, not using drugs or products that contain nicotine and tobacco, and limiting alcohol use. What can I expect for my preventive care visit? Physical exam Your health care provider will check your:  Height and weight. This may be used to calculate body mass index (BMI), which tells if you are at a healthy weight.  Heart rate and blood pressure.  Skin for abnormal spots. Counseling Your health care provider may ask you questions about your:  Alcohol, tobacco, and drug use.  Emotional well-being.  Home and relationship well-being.  Sexual activity.  Eating habits.  Work and work Statistician.  Method of birth control.  Menstrual cycle.  Pregnancy history. What immunizations do I need?  Influenza (flu) vaccine  This is recommended every year. Tetanus, diphtheria, and pertussis (Tdap) vaccine  You may need a Td booster every 10 years. Varicella (chickenpox) vaccine  You may  need this if you have not been vaccinated. Human papillomavirus (HPV) vaccine  If recommended by your health care provider, you may need three doses over 6 months. Measles, mumps, and rubella (MMR) vaccine  You may need at least one dose of MMR. You may also need a second dose. Meningococcal conjugate (MenACWY) vaccine  One dose is recommended if you are age 52-21 years and a first-year college student living in a residence hall, or if you have one of several medical conditions. You may also need additional booster doses. Pneumococcal conjugate (PCV13) vaccine  You may need this if you have certain conditions and were not previously vaccinated. Pneumococcal polysaccharide (PPSV23) vaccine  You may need one or two doses if you smoke cigarettes or if you have certain conditions. Hepatitis A vaccine  You may need this if you have certain conditions or if you travel or work in places where you may be exposed to hepatitis A. Hepatitis B vaccine  You may need this if you have certain conditions or if you travel or work in places where you may be exposed to hepatitis B. Haemophilus influenzae type b (Hib) vaccine  You may need this if you have certain conditions. You may receive vaccines as individual doses or as more than one vaccine together in one shot (combination vaccines). Talk with your health care provider about the risks and benefits of combination vaccines. What tests do I need?  Blood tests  Lipid and cholesterol levels. These may be checked every 5 years starting at age 24.  Hepatitis C test.  Hepatitis B test. Screening  Diabetes screening. This is done by checking your blood sugar (glucose) after you have not eaten for a while (fasting).  Sexually transmitted disease (STD) testing.  BRCA-related cancer screening. This may be done if you have a family history of breast, ovarian, tubal, or peritoneal cancers.  Pelvic exam and Pap test. This may be done every 3 years  starting at age 8. Starting at age 66, this may be done every 5 years if you have a Pap test in combination with an HPV test. Talk with your health care provider about your test results, treatment options, and if necessary, the need for more tests. Follow these instructions at home: Eating and drinking   Eat a diet that includes fresh fruits and vegetables, whole grains, lean protein, and low-fat dairy.  Take vitamin and mineral supplements as recommended by your health care provider.  Do not drink alcohol if: ? Your health care provider tells you not to drink. ? You are pregnant, may be pregnant, or are planning to become pregnant.  If you drink alcohol: ? Limit how much you have to 0-1 drink a day. ? Be aware of how much alcohol is in your drink. In the [place] one drink equals one 12 oz bottle of beer (355 mL), one 5 oz glass of wine (148 mL), or one 1 oz glass of hard liquor (44 mL). Lifestyle  Take daily care of your teeth and gums.  Stay active. Exercise for at least 30 minutes on 5 or more days each week.  Do not use any products that contain nicotine or tobacco, such as cigarettes, e-cigarettes, and chewing tobacco. If you need help quitting, ask your health care provider.  If you are sexually active, practice safe sex. Use a condom or other form of birth control (contraception) in order to prevent pregnancy and STIs (sexually transmitted infections). If you plan to become pregnant, see your health care provider for a preconception visit. What's next?  Visit your health care provider once a year for a well check visit.  Ask your health care provider how often you should have your eyes and teeth checked.  Stay up to date on all vaccines. This information is not intended to replace advice given to you by your health care provider. Make sure you discuss any questions you have with your health care provider. Document Revised: 02/15/2018 Document Reviewed: 02/15/2018 Elsevier  Patient Education  2020 [employer].

## 2020-01-30 ENCOUNTER — Encounter: Payer: Self-pay | Admitting: Physician Assistant

## 2020-01-30 ENCOUNTER — Other Ambulatory Visit: Payer: Self-pay

## 2020-01-30 DIAGNOSIS — E282 Polycystic ovarian syndrome: Secondary | ICD-10-CM

## 2020-01-30 LAB — URINE CYTOLOGY ANCILLARY ONLY
Bacterial Vaginitis-Urine: NEGATIVE
Candida Urine: POSITIVE — AB

## 2020-01-30 MED ORDER — SPIRONOLACTONE 100 MG PO TABS: ORAL_TABLET | ORAL | 1 refills | Status: DC

## 2020-01-30 MED ORDER — METFORMIN HCL 500 MG PO TABS: ORAL_TABLET | ORAL | 5 refills | Status: DC

## 2020-01-31 ENCOUNTER — Other Ambulatory Visit: Payer: Self-pay

## 2020-01-31 MED ORDER — FLUCONAZOLE 150 MG PO TABS
150.0000 mg | ORAL_TABLET | Freq: Once | ORAL | 0 refills | Status: AC
Start: 2020-01-31 — End: 2020-01-31

## 2020-02-03 ENCOUNTER — Encounter: Payer: Self-pay | Admitting: Physician Assistant

## 2020-02-03 NOTE — Telephone Encounter (Signed)
Should not be worsening with Diflucan. Her ancillary test was negative for BV but I would be concerned that somehow treating the yeast present caused an issue with bacterial overgrowth. I would sent in repeat course of 150 mg Diflucan daily along with Metrogel applied vaginally at night for 7 nights.

## 2020-02-04 ENCOUNTER — Other Ambulatory Visit: Payer: Self-pay | Admitting: Emergency Medicine

## 2020-02-04 DIAGNOSIS — B373 Candidiasis of vulva and vagina: Secondary | ICD-10-CM

## 2020-02-04 DIAGNOSIS — B3731 Acute candidiasis of vulva and vagina: Secondary | ICD-10-CM

## 2020-02-04 MED ORDER — METRONIDAZOLE 0.75 % VA GEL: 1.0000 | Freq: Every day | VAGINAL | 0 refills | Status: AC

## 2020-02-04 MED ORDER — FLUCONAZOLE 150 MG PO TABS
150.0000 mg | ORAL_TABLET | Freq: Once | ORAL | 0 refills | Status: AC
Start: 2020-02-04 — End: 2020-02-04

## 2020-02-10 DIAGNOSIS — F411 Generalized anxiety disorder: Secondary | ICD-10-CM | POA: Diagnosis not present

## 2020-02-10 DIAGNOSIS — F900 Attention-deficit hyperactivity disorder, predominantly inattentive type: Secondary | ICD-10-CM | POA: Diagnosis not present

## 2020-02-10 DIAGNOSIS — F332 Major depressive disorder, recurrent severe without psychotic features: Secondary | ICD-10-CM | POA: Diagnosis not present

## 2020-02-10 DIAGNOSIS — F410 Panic disorder [episodic paroxysmal anxiety] without agoraphobia: Secondary | ICD-10-CM | POA: Diagnosis not present

## 2020-03-02 NOTE — Telephone Encounter (Signed)
If ongoing after treatment, she needs repeat evaluation.

## 2020-03-04 ENCOUNTER — Encounter: Payer: Self-pay | Admitting: Physician Assistant

## 2020-03-04 ENCOUNTER — Other Ambulatory Visit: Payer: Self-pay

## 2020-03-04 ENCOUNTER — Ambulatory Visit (INDEPENDENT_AMBULATORY_CARE_PROVIDER_SITE_OTHER): Payer: BC Managed Care – PPO | Admitting: Physician Assistant

## 2020-03-04 ENCOUNTER — Other Ambulatory Visit (HOSPITAL_COMMUNITY)
Admission: RE | Admit: 2020-03-04 | Discharge: 2020-03-04 | Disposition: A | Discharge status: Home / Self Care | Payer: BC Managed Care – PPO | Source: Ambulatory Visit | Attending: Physician Assistant | Admitting: Physician Assistant

## 2020-03-04 VITALS — BP 120/80 | HR 99 | Temp 98.1°F | Resp 16 | Ht 68.5 in | Wt 178.0 lb

## 2020-03-04 DIAGNOSIS — F4321 Adjustment disorder with depressed mood: Secondary | ICD-10-CM | POA: Diagnosis not present

## 2020-03-04 DIAGNOSIS — N898 Other specified noninflammatory disorders of vagina: Secondary | ICD-10-CM

## 2020-03-04 MED ORDER — FLUCONAZOLE 150 MG PO TABS: ORAL_TABLET | ORAL | 0 refills | Status: DC

## 2020-03-04 MED ORDER — FLUCONAZOLE 150 MG PO TABS
ORAL_TABLET | ORAL | 0 refills | Status: DC
Start: 2020-03-04 — End: 2020-03-04

## 2020-03-04 NOTE — Progress Notes (Signed)
Patient presents to clinic today c/o recurrent episode of vaginal discharge, noting a thick white discharge and vaginal pruritus after treatment for BV with metrogel. Denies fever, chills, malaise or fatigue. Denies dysuria, urinary urgency or frequency. Not recently sexually active.   Patient also very tearful this morning, noting that she woke up last 2022/10/11 and her fiance was dead beside her, passing in the night. Notes he had no known health issues and was in his early 104s. As such he underwent autopsy. They were able to get body back for burial but were told they would not get results for autopsy, etc for another 6 months. She is having a very hard time dealing with this sudden passing, noting labile emotions, frequent crying spells. She notes she just does not understand what happened. Notes she and her fiance's mother have been trying to support each other during this hard time.    Past Medical History:  Diagnosis Date  . History of chickenpox     Current Outpatient Medications on File Prior to Visit  Medication Sig Dispense Refill  . ALPRAZolam (XANAX) 1 MG tablet Take 1 tablet by mouth daily as needed.  2  . amphetamine-dextroamphetamine (ADDERALL) 30 MG tablet Take 1 tablet by mouth 4 (four) times daily.    Marland Kitchen lamoTRIgine (LAMICTAL) 100 MG tablet Take 100 mg by mouth daily.    Marland Kitchen lamoTRIgine (LAMICTAL) 25 MG tablet Take 25 mg by mouth daily.     . Melatonin 5 MG CAPS Take 2 capsules by mouth at bedtime.    . metFORMIN (GLUCOPHAGE) 500 MG tablet TAKE 1 TABLET(500 MG) BY MOUTH TWICE DAILY 60 tablet 5  . PARAGARD INTRAUTERINE COPPER IUD IUD by Intrauterine route.    Marland Kitchen spironolactone (ALDACTONE) 100 MG tablet TAKE 1 TABLET(100 MG) BY MOUTH DAILY. 90 tablet 1  . traZODone (DESYREL) 100 MG tablet Take 1 tablet by mouth at bedtime.  0   No current facility-administered medications on file prior to visit.    Allergies  Allergen Reactions  . Adhesive [Tape] Rash    Family History   Problem Relation Age of Onset  . Multiple sclerosis Mother   . COPD Father   . Cancer Father        Lung  . Heart attack Father   . Cancer Paternal Aunt        Breast  . Multiple sclerosis Paternal Aunt   . Cancer Paternal Aunt        Breast    Social History   Socioeconomic History  . Marital status: Single    Spouse name: Not on file  . Number of children: Not on file  . Years of education: Not on file  . Highest education level: Not on file  Occupational History  . Not on file  Tobacco Use  . Smoking status: Former Smoker    Types: Cigarettes    Quit date: 2010    Years since quitting: 11.7  . Smokeless tobacco: Never Used  Vaping Use  . Vaping Use: Never used  Substance and Sexual Activity  . Alcohol use: Yes    Comment: 3-4 glasses of wine  . Drug use: Not Currently  . Sexual activity: Yes    Birth control/protection: I.U.D.  Other Topics Concern  . Not on file  Social History Narrative  . Not on file   Social Determinants of Health   Financial Resource Strain:   . Difficulty of Paying Living Expenses: Not on file  Food Insecurity:   .  Worried About Charity fundraiser in the Last Year: Not on file  . Ran Out of Food in the Last Year: Not on file  Transportation Needs:   . Lack of Transportation (Medical): Not on file  . Lack of Transportation (Non-Medical): Not on file  Physical Activity:   . Days of Exercise per Week: Not on file  . Minutes of Exercise per Session: Not on file  Stress:   . Feeling of Stress : Not on file  Social Connections:   . Frequency of Communication with Friends and Family: Not on file  . Frequency of Social Gatherings with Friends and Family: Not on file  . Attends Religious Services: Not on file  . Active Member of Clubs or Organizations: Not on file  . Attends Archivist Meetings: Not on file  . Marital Status: Not on file    Review of Systems - See HPI.  All other ROS are negative.  BP 120/80   Pulse 99    Temp 98.1 F (36.7 C) (Temporal)   Resp 16   Ht 5' 8.5" (1.74 m)   Wt 178 lb (80.7 kg)   SpO2 99%   BMI 26.67 kg/m   Physical Exam Vitals reviewed. Exam conducted with a chaperone present.  Constitutional:      Appearance: Normal appearance.  Cardiovascular:     Rate and Rhythm: Normal rate and regular rhythm.  Pulmonary:     Effort: Pulmonary effort is normal.  Abdominal:     Hernia: There is no hernia in the left inguinal area or right inguinal area.  Genitourinary:    General: Normal vulva.     Exam position: Supine.     Pubic Area: No rash.      Labia:        Right: No rash, tenderness or lesion.        Left: No rash, tenderness or lesion.      Vagina: Vaginal discharge (moderate amount of thick, white discharge) present.     Cervix: Normal.  Lymphadenopathy:     Lower Body: No right inguinal adenopathy. No left inguinal adenopathy.  Neurological:     Mental Status: She is alert.     Recent Results (from the past 2160 hour(s))  Urine cytology ancillary only([place])     Status: Abnormal   Collection Time: 01/29/20  8:25 AM  Result Value Ref Range   Bacterial Vaginitis-Urine Negative    Candida Urine Positive (A)    Molecular Comment      This specimen does not meet the strict criteria set by the FDA. The   Molecular Comment      result interpretation should be considered in conjunction with the   Molecular Comment patient's clinical history.   CBC with Differential/Platelet     Status: None   Collection Time: 01/29/20  8:37 AM  Result Value Ref Range   WBC 5.4 4.0 - 10.5 K/uL   RBC 4.39 3.87 - 5.11 Mil/uL   Hemoglobin 14.0 12.0 - 15.0 g/dL   HCT 41.4 36 - 46 %   MCV 94.4 78.0 - 100.0 fl   MCHC 33.8 30.0 - 36.0 g/dL   RDW 12.6 11.5 - 15.5 %   Platelets 299.0 150 - 400 K/uL   Neutrophils Relative % 63.9 43 - 77 %   Lymphocytes Relative 26.8 12 - 46 %   Monocytes Relative 7.4 3 - 12 %   Eosinophils Relative 1.5 0 - 5 %  Basophils Relative 0.4 0 - 3  %   Neutro Abs 3.5 1.4 - 7.7 K/uL   Lymphs Abs 1.4 0.7 - 4.0 K/uL   Monocytes Absolute 0.4 0 - 1 K/uL   Eosinophils Absolute 0.1 0 - 0 K/uL   Basophils Absolute 0.0 0 - 0 K/uL  Comprehensive metabolic panel     Status: None   Collection Time: 01/29/20  8:37 AM  Result Value Ref Range   Sodium 137 135 - 145 mEq/L   Potassium 4.2 3.5 - 5.1 mEq/L   Chloride 101 96 - 112 mEq/L   CO2 24 19 - 32 mEq/L   Glucose, Bld 85 70 - 99 mg/dL   BUN 14 6 - 23 mg/dL   Creatinine, Ser 1.01 0.40 - 1.20 mg/dL   Total Bilirubin 0.5 0.2 - 1.2 mg/dL   Alkaline Phosphatase 59 39 - 117 U/L   AST 12 0 - 37 U/L   ALT 10 0 - 35 U/L   Total Protein 6.4 6.0 - 8.3 g/dL   Albumin 4.3 3.5 - 5.2 g/dL   GFR 61.92 >60.00 mL/min   Calcium 9.6 8.4 - 10.5 mg/dL  Hemoglobin A1c     Status: None   Collection Time: 01/29/20  8:37 AM  Result Value Ref Range   Hgb A1c MFr Bld 4.9 4.6 - 6.5 %    Comment: Glycemic Control Guidelines for People with Diabetes:Non Diabetic:  <6%Goal of Therapy: <7%Additional Action Suggested:  >8%   Lipid panel     Status: None   Collection Time: 01/29/20  8:37 AM  Result Value Ref Range   Cholesterol 170 0 - 200 mg/dL    Comment: ATP III Classification       Desirable:  < 200 mg/dL               Borderline High:  200 - 239 mg/dL          High:  > = 240 mg/dL   Triglycerides 49.0 0 - 149 mg/dL    Comment: Normal:  <150 mg/dLBorderline High:  150 - 199 mg/dL   HDL 63.70 >39.00 mg/dL   VLDL 9.8 0.0 - 40.0 mg/dL   LDL Cholesterol 97 0 - 99 mg/dL   Total CHOL/HDL Ratio 3     Comment:                Men          Women1/2 Average Risk     3.4          3.3Average Risk          5.0          4.42X Average Risk          9.6          7.13X Average Risk          15.0          11.0                       NonHDL 106.39     Comment: NOTE:  Non-HDL goal should be 30 mg/dL higher than patient's LDL goal (i.e. LDL goal of < 70 mg/dL, would have non-HDL goal of < 100 mg/dL)  TSH     Status: None   Collection  Time: 01/29/20  8:37 AM  Result Value Ref Range   TSH 1.62 0.35 - 4.50 uIU/mL    Assessment/Plan: 1. Grief reaction Sudden loss. Normal grief  response. Discuss potential for medication but she declines. Discussed grief counseling -- gave patient handouts with recommended providers and contact information. Will keep her in our thoughts and prayers during this difficult time in her life.   2. Vaginal discharge Most consistent with yeast giving history and examination. Vaginal swab sent to lab for confirmation. Will start Diflucan 150 mg starting today. Repeat dose in 3 days. Will alter regimen based on results.  - Cervicovaginal ancillary only( [place])  This visit occurred during the SARS-CoV-2 public health emergency.  Safety protocols were in place, including screening questions prior to the visit, additional usage of staff PPE, and extensive cleaning of exam room while observing appropriate contact time as indicated for disinfecting solutions.     Leeanne Rio, PA-C

## 2020-03-04 NOTE — Patient Instructions (Signed)
Please take the Diflucan regimen as directed. Make sure to take a daily probiotic.  I will call as soon as we have swab results available and we will adjust treatment accordingly.   I am setting you up with Dermatology for ongoing issue with skin odor.   I am so very sorry for your loss. I am keeping you in my thoughts and prayers.  Please use the handouts given to be set up with a grief counselor during this time. Rely on your support system as much as possible.   If you feel things are worsening -- mood, anxiety, etc, please let me know so we can adjust medications.

## 2020-03-05 LAB — CERVICOVAGINAL ANCILLARY ONLY
Bacterial Vaginitis (gardnerella): NEGATIVE
Candida Glabrata: NEGATIVE
Candida Vaginitis: NEGATIVE
Comment: NEGATIVE
Comment: NEGATIVE
Comment: NEGATIVE

## 2020-03-13 ENCOUNTER — Encounter: Payer: Self-pay | Admitting: Physician Assistant

## 2020-03-13 DIAGNOSIS — L748 Other eccrine sweat disorders: Secondary | ICD-10-CM

## 2020-04-27 DIAGNOSIS — F900 Attention-deficit hyperactivity disorder, predominantly inattentive type: Secondary | ICD-10-CM | POA: Diagnosis not present

## 2020-04-27 DIAGNOSIS — L821 Other seborrheic keratosis: Secondary | ICD-10-CM | POA: Diagnosis not present

## 2020-04-27 DIAGNOSIS — F332 Major depressive disorder, recurrent severe without psychotic features: Secondary | ICD-10-CM | POA: Diagnosis not present

## 2020-04-27 DIAGNOSIS — F411 Generalized anxiety disorder: Secondary | ICD-10-CM | POA: Diagnosis not present

## 2020-04-27 DIAGNOSIS — D1801 Hemangioma of skin and subcutaneous tissue: Secondary | ICD-10-CM | POA: Diagnosis not present

## 2020-04-27 DIAGNOSIS — F410 Panic disorder [episodic paroxysmal anxiety] without agoraphobia: Secondary | ICD-10-CM | POA: Diagnosis not present

## 2020-07-13 DIAGNOSIS — F411 Generalized anxiety disorder: Secondary | ICD-10-CM | POA: Diagnosis not present

## 2020-07-13 DIAGNOSIS — F900 Attention-deficit hyperactivity disorder, predominantly inattentive type: Secondary | ICD-10-CM | POA: Diagnosis not present

## 2020-07-13 DIAGNOSIS — F332 Major depressive disorder, recurrent severe without psychotic features: Secondary | ICD-10-CM | POA: Diagnosis not present

## 2020-08-02 ENCOUNTER — Other Ambulatory Visit: Payer: Self-pay | Admitting: Physician Assistant

## 2020-08-02 DIAGNOSIS — E282 Polycystic ovarian syndrome: Secondary | ICD-10-CM

## 2020-08-05 NOTE — Telephone Encounter (Signed)
Pt called in stating that she is out of medication. Please advise she has an upcoming in April with Sam.   Pt uses [facility] in [place]

## 2020-08-05 NOTE — Telephone Encounter (Signed)
Pt made aware that this has been sent to the pharmacy for her//ELEA

## 2020-09-10 DIAGNOSIS — N898 Other specified noninflammatory disorders of vagina: Secondary | ICD-10-CM | POA: Diagnosis not present

## 2020-09-28 DIAGNOSIS — F331 Major depressive disorder, recurrent, moderate: Secondary | ICD-10-CM | POA: Diagnosis not present

## 2020-09-28 DIAGNOSIS — F411 Generalized anxiety disorder: Secondary | ICD-10-CM | POA: Diagnosis not present

## 2020-09-28 DIAGNOSIS — F902 Attention-deficit hyperactivity disorder, combined type: Secondary | ICD-10-CM | POA: Diagnosis not present

## 2020-09-30 ENCOUNTER — Encounter: Payer: Self-pay | Admitting: Physician Assistant

## 2020-09-30 ENCOUNTER — Ambulatory Visit (INDEPENDENT_AMBULATORY_CARE_PROVIDER_SITE_OTHER): Payer: BC Managed Care – PPO

## 2020-09-30 ENCOUNTER — Ambulatory Visit: Payer: BC Managed Care – PPO | Admitting: Physician Assistant

## 2020-09-30 ENCOUNTER — Other Ambulatory Visit: Payer: Self-pay

## 2020-09-30 VITALS — BP 120/80 | HR 92 | Temp 98.0°F | Ht 68.5 in | Wt 175.5 lb

## 2020-09-30 DIAGNOSIS — E282 Polycystic ovarian syndrome: Secondary | ICD-10-CM

## 2020-09-30 DIAGNOSIS — R5383 Other fatigue: Secondary | ICD-10-CM | POA: Diagnosis not present

## 2020-09-30 DIAGNOSIS — R634 Abnormal weight loss: Secondary | ICD-10-CM

## 2020-09-30 DIAGNOSIS — Z1159 Encounter for screening for other viral diseases: Secondary | ICD-10-CM

## 2020-09-30 DIAGNOSIS — R059 Cough, unspecified: Secondary | ICD-10-CM

## 2020-09-30 DIAGNOSIS — N3010 Interstitial cystitis (chronic) without hematuria: Secondary | ICD-10-CM | POA: Insufficient documentation

## 2020-09-30 DIAGNOSIS — R4586 Emotional lability: Secondary | ICD-10-CM | POA: Insufficient documentation

## 2020-09-30 LAB — COMPREHENSIVE METABOLIC PANEL
ALT: 14 U/L (ref 0–35)
AST: 15 U/L (ref 0–37)
Albumin: 4.3 g/dL (ref 3.5–5.2)
Alkaline Phosphatase: 105 U/L (ref 39–117)
BUN: 17 mg/dL (ref 6–23)
CO2: 30 mEq/L (ref 19–32)
Calcium: 9.6 mg/dL (ref 8.4–10.5)
Chloride: 100 mEq/L (ref 96–112)
Creatinine, Ser: 1 mg/dL (ref 0.40–1.20)
GFR: 72.25 mL/min (ref >60.00)
Glucose, Bld: 58 mg/dL — ABNORMAL LOW (ref 70–99)
Potassium: 4.2 mEq/L (ref 3.5–5.1)
Sodium: 138 mEq/L (ref 135–145)
Total Bilirubin: 0.3 mg/dL (ref 0.2–1.2)
Total Protein: 6.9 g/dL (ref 6.0–8.3)

## 2020-09-30 LAB — CBC WITH DIFFERENTIAL/PLATELET
Basophils Absolute: 0 10*3/uL (ref 0.0–0.1)
Basophils Relative: 0.3 % (ref 0.0–3.0)
Eosinophils Absolute: 0.1 10*3/uL (ref 0.0–0.7)
Eosinophils Relative: 1 % (ref 0.0–5.0)
HCT: 44.6 % (ref 36.0–46.0)
Hemoglobin: 15 g/dL (ref 12.0–15.0)
Lymphocytes Relative: 15.4 % (ref 12.0–46.0)
Lymphs Abs: 1.5 10*3/uL (ref 0.7–4.0)
MCHC: 33.6 g/dL (ref 30.0–36.0)
MCV: 91.9 fl (ref 78.0–100.0)
Monocytes Absolute: 0.7 10*3/uL (ref 0.1–1.0)
Monocytes Relative: 7.2 % (ref 3.0–12.0)
Neutro Abs: 7.5 10*3/uL (ref 1.4–7.7)
Neutrophils Relative %: 76.1 % (ref 43.0–77.0)
Platelets: 356 10*3/uL (ref 150.0–400.0)
RBC: 4.85 Mil/uL (ref 3.87–5.11)
RDW: 13.8 % (ref 11.5–15.5)
WBC: 9.8 10*3/uL (ref 4.0–10.5)

## 2020-09-30 LAB — TSH: TSH: 1.62 u[IU]/mL (ref 0.35–4.50)

## 2020-09-30 LAB — VITAMIN B12: Vitamin B-12: 210 pg/mL — ABNORMAL LOW (ref 211–911)

## 2020-09-30 LAB — VITAMIN D 25 HYDROXY (VIT D DEFICIENCY, FRACTURES): VITD: 41.5 ng/mL (ref 30.00–100.00)

## 2020-09-30 MED ORDER — METFORMIN HCL 500 MG PO TABS: 500.0000 mg | ORAL_TABLET | Freq: Two times a day (BID) | ORAL | 3 refills | Status: DC

## 2020-09-30 MED ORDER — BENZONATATE 100 MG PO CAPS: 100.0000 mg | ORAL_CAPSULE | Freq: Three times a day (TID) | ORAL | 1 refills | Status: DC | PRN

## 2020-09-30 MED ORDER — SPIRONOLACTONE 100 MG PO TABS: 100.0000 mg | ORAL_TABLET | Freq: Every day | ORAL | 3 refills | Status: DC

## 2020-09-30 NOTE — Patient Instructions (Signed)
It was great to see you!  Chest xray for cough. I will send in antibiotic after I see results of xray. Tessalon perles sent. Please trial over the counter antihistamine of choice (claritin, allergra, zyrtec, etc)  Refills of spiro and metformin sent in  Keep an eye on your weight, if you continue to lose, I will need you to follow-up with me or another provider in our office for further evaluation  Take care,  [name] PA-C

## 2020-09-30 NOTE — Progress Notes (Signed)
Veronica Ramirez is a 35 y.o. female is here for transfer of care and medication refills.  I acted as a Education administrator for [employer], PA-C Anselmo Pickler, LPN   History of Present Illness:   Chief Complaint  Patient presents with  . Transfer of care  . insulin resistance  . Cough  . Fatigue    HPI  Insulin resistance Currently taking Metformin 500 mg BID and spiro 100 mg daily. Has her second copper IUD in. Doing well overall. Has been on this regimen for almost a decade. Needs refills.  Cough Pt c/o cough x 3 weeks, chest congestion and hurts when coughing. She is expectorating thick yellow sputum. Has not tried any medications consistently. Pt was in [place] 3 weeks ago, COVID test negative to return to [place]. Denies fever, SOB or chills. Slight PND. Does not typically suffer from allergies.  Most recently had COVID in January 2022 but had overall mild course.   Fatigue Pt c/o increase in fatigue for the past 3 months. She is sleeping well on current medication regimen and does not feel groggy in AM. Symptoms are most pronounced around 3-4 pm. Happening daily. Works from 37:30a-5:30p -- Mon through Sat. Does not nap. No concerns for sleep apnea. Once cup of coffee daily.  Eats breakfast, drinks gallon of water daily, and then eats dinner. Appetite is good. Has had unintentional weight loss, see trends below. Denies: polyuria, polydipsia, abdominal pain, rectal bleeding, unusual night sweats.  Wt Readings from Last 10 Encounters:  09/30/20 175 lb 8 oz (79.6 kg)  03/04/20 178 lb (80.7 kg)  01/29/20 188 lb (85.3 kg)  11/26/19 188 lb 3.2 oz (85.4 kg)  09/10/19 195 lb 3.2 oz (88.5 kg)  05/23/19 210 lb (95.3 kg)  04/11/19 208 lb (94.3 kg)  08/03/18 212 lb (96.2 kg)  05/30/18 210 lb (95.3 kg)     Health Maintenance Due  Topic Date Due  . Hepatitis C Screening  Never done  . PAP SMEAR-Modifier  08/07/2020    Past Medical History:  Diagnosis Date  . History of chickenpox       Social History   Tobacco Use  . Smoking status: Former Smoker    Types: Cigarettes    Quit date: 2010    Years since quitting: 12.2  . Smokeless tobacco: Never Used  Vaping Use  . Vaping Use: Never used  Substance Use Topics  . Alcohol use: Yes    Comment: 3-4 glasses of wine  . Drug use: Not Currently    Past Surgical History:  Procedure Laterality Date  . KNEE ARTHROSCOPY W/ MENISCAL REPAIR Left 12/12/2019    Family History  Problem Relation Age of Onset  . Multiple sclerosis Mother   . COPD Father   . Cancer Father        Lung  . Heart attack Father   . Cancer Paternal Aunt        Breast  . Multiple sclerosis Paternal Aunt   . Cancer Paternal Aunt        Breast    PMHx, SurgHx, SocialHx, FamHx, Medications, and Allergies were reviewed in the Visit Navigator and updated as appropriate.   Patient Active Problem List   Diagnosis Date Noted  . Mood swings 09/30/2020  . Interstitial cystitis 09/30/2020  . Attention deficit hyperactivity disorder (ADHD), predominantly hyperactive type 05/30/2018  . PCOS (polycystic ovarian syndrome) 05/30/2018  . Obesity, Class I, BMI 30-34.9 08/18/2014    Social History   Tobacco  Use  . Smoking status: Former Smoker    Types: Cigarettes    Quit date: 2010    Years since quitting: 12.2  . Smokeless tobacco: Never Used  Vaping Use  . Vaping Use: Never used  Substance Use Topics  . Alcohol use: Yes    Comment: 3-4 glasses of wine  . Drug use: Not Currently    Current Medications and Allergies:    Current Outpatient Medications:  .  ALPRAZolam (XANAX) 1 MG tablet, Take 1 tablet by mouth daily as needed., Disp: , Rfl: 2 .  amphetamine-dextroamphetamine (ADDERALL) 30 MG tablet, Take 1 tablet by mouth 4 (four) times daily., Disp: , Rfl:  .  benzonatate (TESSALON) 100 MG capsule, Take 1 capsule (100 mg total) by mouth 3 (three) times daily as needed for cough., Disp: 60 capsule, Rfl: 1 .  lamoTRIgine (LAMICTAL) 100 MG  tablet, Take 100 mg by mouth daily., Disp: , Rfl:  .  lamoTRIgine (LAMICTAL) 25 MG tablet, Take 25 mg by mouth daily. , Disp: , Rfl:  .  Melatonin 5 MG CAPS, Take 2 capsules by mouth at bedtime., Disp: , Rfl:  .  PARAGARD INTRAUTERINE COPPER IUD IUD, by Intrauterine route., Disp: , Rfl:  .  traZODone (DESYREL) 100 MG tablet, Take 1 tablet by mouth at bedtime., Disp: , Rfl: 0 .  metFORMIN (GLUCOPHAGE) 500 MG tablet, Take 1 tablet (500 mg total) by mouth 2 (two) times daily with a meal., Disp: 180 tablet, Rfl: 3 .  spironolactone (ALDACTONE) 100 MG tablet, Take 1 tablet (100 mg total) by mouth daily., Disp: 90 tablet, Rfl: 3   Allergies  Allergen Reactions  . Adhesive [Tape] Rash    Review of Systems   ROS Negative unless otherwise specified per HPI.  Vitals:   Vitals:   09/30/20 0809  BP: 120/80  Pulse: 92  Temp: 98 F (36.7 C)  TempSrc: Temporal  SpO2: 99%  Weight: 175 lb 8 oz (79.6 kg)  Height: 5' 8.5" (1.74 m)     Body mass index is 26.3 kg/m.  Physical Exam:    Physical Exam Vitals and nursing note reviewed.  Constitutional:      General: She is not in acute distress.    Appearance: She is well-developed. She is not ill-appearing or toxic-appearing.  HENT:     Head: Normocephalic and atraumatic.     Right Ear: Tympanic membrane, ear canal and external ear normal. Tympanic membrane is not erythematous, retracted or bulging.     Left Ear: Tympanic membrane, ear canal and external ear normal. Tympanic membrane is not erythematous, retracted or bulging.     Nose: Nose normal.     Right Sinus: No maxillary sinus tenderness or frontal sinus tenderness.     Left Sinus: No maxillary sinus tenderness or frontal sinus tenderness.     Mouth/Throat:     Pharynx: Uvula midline. No posterior oropharyngeal erythema.  Eyes:     General: Lids are normal.     Conjunctiva/sclera: Conjunctivae normal.  Neck:     Trachea: Trachea normal.  Cardiovascular:     Rate and Rhythm:  Normal rate and regular rhythm.     Pulses: Normal pulses.     Heart sounds: Normal heart sounds, S1 normal and S2 normal.     Comments: No LE edema Pulmonary:     Effort: Pulmonary effort is normal.     Breath sounds: Normal breath sounds. No decreased breath sounds, wheezing, rhonchi or rales.  Lymphadenopathy:  Cervical: No cervical adenopathy.  Skin:    General: Skin is warm and dry.  Neurological:     Mental Status: She is alert.     GCS: GCS eye subscore is 4. GCS verbal subscore is 5. GCS motor subscore is 6.  Psychiatric:        Speech: Speech normal.        Behavior: Behavior normal. Behavior is cooperative.      Assessment and Plan:    Veronica Ramirez was seen today for transfer of care, insulin resistance, cough and fatigue.  Diagnoses and all orders for this visit:  Cough No red flags on exam. Vitals are stable. Will trial tessalon perles and update chest xray. Likely start abx due to worsening symptoms, will await CXR results. -     DG Chest 2 View; Future  PCOS (polycystic ovarian syndrome) Stable per patient. Will refill metformin 500 mg BID and spiro 100 mg daily. -     metFORMIN (GLUCOPHAGE) 500 MG tablet; Take 1 tablet (500 mg total) by mouth 2 (two) times daily with a meal.  Fatigue, unspecified type Update blood work today. Continue healthy lifestyle. Low threshold to trial vitamin B12 supplements and/or vitamin D. Follow-up based on clinical symptoms and lab results. -     DG Chest 2 View; Future -     TSH -     VITAMIN D 25 Hydroxy (Vit-D Deficiency, Fractures) -     Vitamin B12  Unintentional weight loss Unclear etiology. Update blood work. Past blood work reviewed and was overall WNL. I have advised her as follows: "Keep an eye on your weight, if you continue to lose, I will need you to follow-up with me or another provider in our office for further evaluation." -     DG Chest 2 View; Future -     CBC with Differential/Platelet -      Comprehensive metabolic panel -     TSH  Encounter for screening for other viral diseases -     Hepatitis C Antibody  Other orders -     spironolactone (ALDACTONE) 100 MG tablet; Take 1 tablet (100 mg total) by mouth daily. -     benzonatate (TESSALON) 100 MG capsule; Take 1 capsule (100 mg total) by mouth 3 (three) times daily as needed for cough.   CMA or LPN served as scribe during this visit. History, Physical, and Plan performed by medical provider. The above documentation has been reviewed and is accurate and complete.  [name], PA-C [place] [place] 09/30/2020  Follow-up: No follow-ups on file.

## 2020-10-01 ENCOUNTER — Other Ambulatory Visit: Payer: Self-pay | Admitting: Physician Assistant

## 2020-10-01 LAB — HEPATITIS C ANTIBODY
Hepatitis C Ab: NONREACTIVE
SIGNAL TO CUT-OFF: 0 (ref <1.00)

## 2020-10-01 MED ORDER — AZITHROMYCIN 250 MG PO TABS: ORAL_TABLET | ORAL | 0 refills | Status: DC

## 2020-10-13 ENCOUNTER — Other Ambulatory Visit: Payer: Self-pay

## 2020-10-13 ENCOUNTER — Ambulatory Visit (INDEPENDENT_AMBULATORY_CARE_PROVIDER_SITE_OTHER): Payer: BC Managed Care – PPO

## 2020-10-13 DIAGNOSIS — E538 Deficiency of other specified B group vitamins: Secondary | ICD-10-CM | POA: Diagnosis not present

## 2020-10-13 MED ORDER — CYANOCOBALAMIN 1000 MCG/ML IJ SOLN
1000.0000 ug | Freq: Once | INTRAMUSCULAR | Status: AC
  Administered 2020-10-13: 1000 ug via INTRAMUSCULAR

## 2020-10-13 NOTE — Progress Notes (Signed)
Per orders of [name], [place], injection of B-12 given by Tobe Sos in left deltoid. Patient tolerated injection well. Patient will self administer at home in 1 week. Pt was educated on home to give herself the B 12 injection, and given materials to take home.

## 2020-10-20 ENCOUNTER — Telehealth: Payer: Self-pay

## 2020-10-20 MED ORDER — "BD LUER-LOK SYRINGE 25G X 1"" 3 ML MISC": 0 refills | Status: AC

## 2020-10-20 MED ORDER — CYANOCOBALAMIN 1000 MCG/ML IJ SOLN: INTRAMUSCULAR | 5 refills | Status: DC

## 2020-10-20 NOTE — Telephone Encounter (Signed)
Left message on voicemail Rx's for Vit B12 injections have been sent to the pharmacy as requested. Any questions please call office.

## 2020-10-20 NOTE — Telephone Encounter (Signed)
Patient states she is going to be self administering B12 injections at home and would like them sent it to [facility] [place] she is due for tomorrow

## 2020-11-01 IMAGING — MR MR KNEE*L* W/O CM
7 series · 40 of 40 positions shown · non-contrast
Comparison: Left knee x-rays dated September 10, 2019.

CLINICAL DATA: Left knee pain and instability for the past 4
months. No injury. History of prior surgery for meniscal tear in
5220.

EXAM:
MRI OF THE LEFT KNEE WITHOUT CONTRAST
TECHNIQUE: Multiplanar, multisequence MR imaging of the knee was performed. No
intravenous contrast was administered.

[Series 3: T2 fat-sat · axial · 4.0mm · 0.53mm/px · z∈[-92,+53]mm · 6 of 30 slices shown (1 of 3)]
[im 1/30]
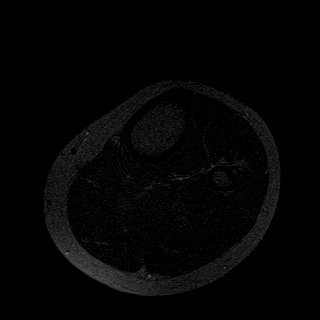
[im 6/30]
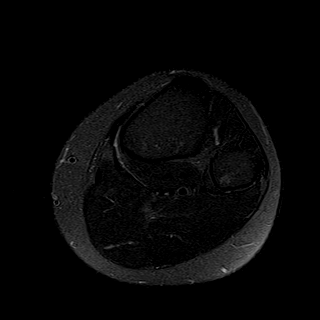
[im 12/30]
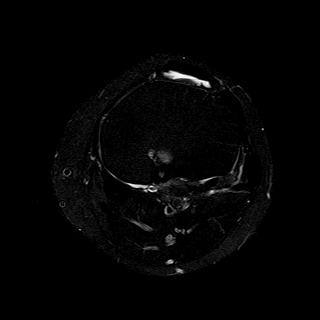
[im 18/30]
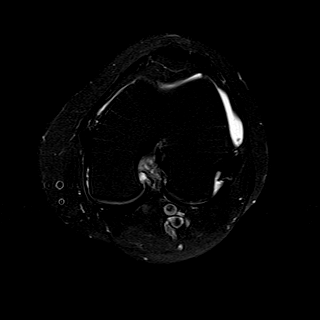
[im 24/30]
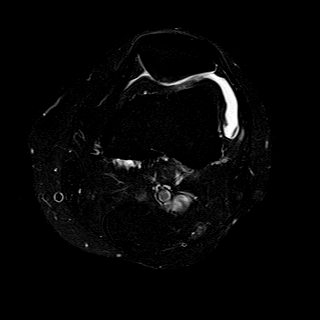
[im 30/30]
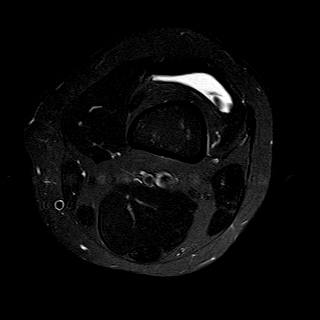

[Series 4: T1 · coronal · 4.0mm · 0.62mm/px · 6 of 31 slices shown]
[im 1/31]
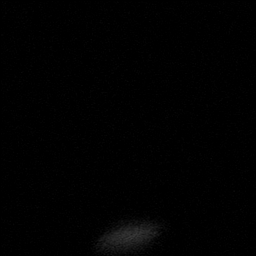
[im 7/31]
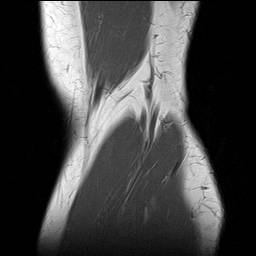
[im 13/31]
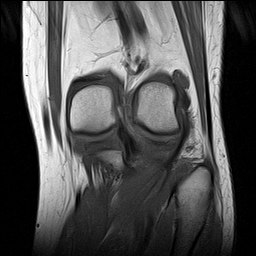
[im 19/31]
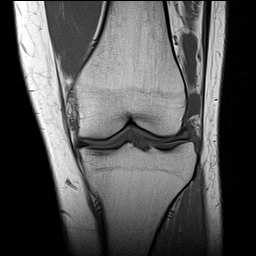
[im 25/31]
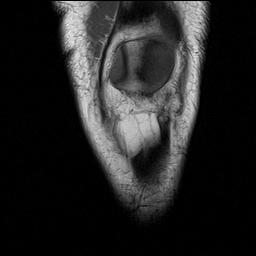
[im 31/31]
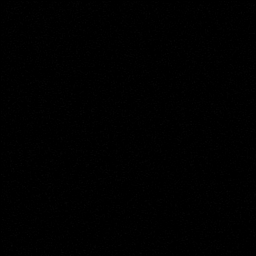

[Series 5: T2 fat-sat · coronal · 4.0mm · 0.62mm/px · 6 of 31 slices shown (2 of 3)]
[im 1/31]
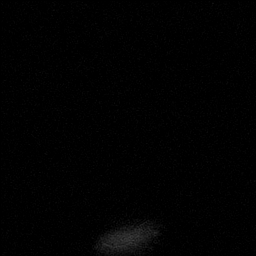
[im 7/31]
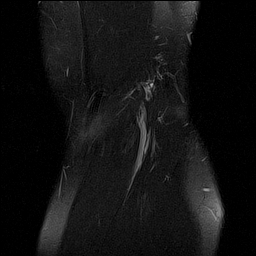
[im 13/31]
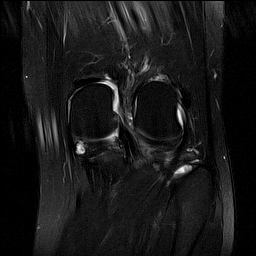
[im 19/31]
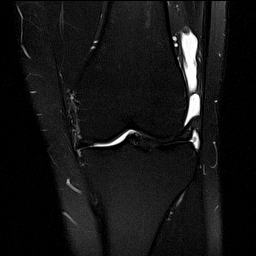
[im 25/31]
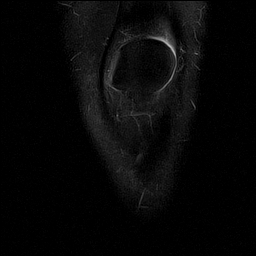
[im 31/31]
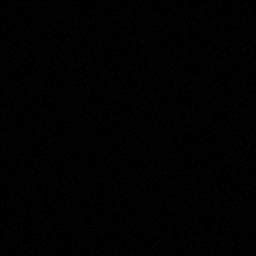

[Series 6: PD fat-sat · coronal · 4.0mm · 0.62mm/px · 6 of 31 slices shown (1 of 3)]
[im 1/31]
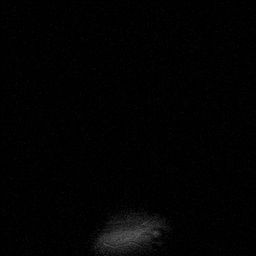
[im 7/31]
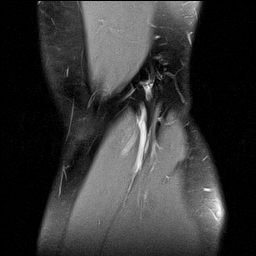
[im 13/31]
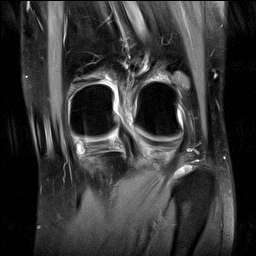
[im 19/31]
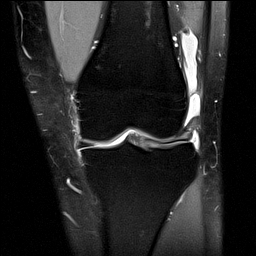
[im 25/31]
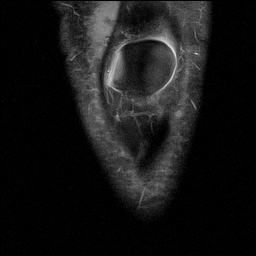
[im 31/31]
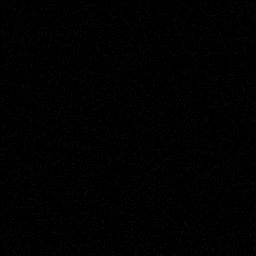

[Series 7: PD fat-sat · sagittal · 3.0mm · 0.62mm/px · 6 of 33 slices shown (2 of 3)]
[im 1/33]
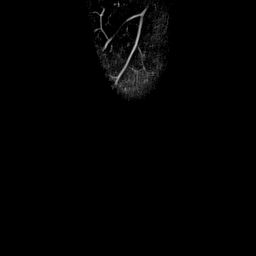
[im 7/33]
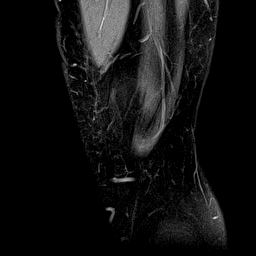
[im 13/33]
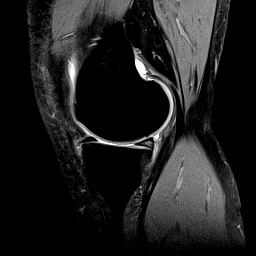
[im 20/33]
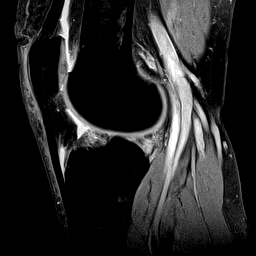
[im 26/33]
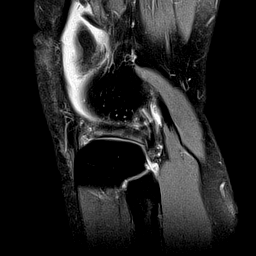
[im 33/33]
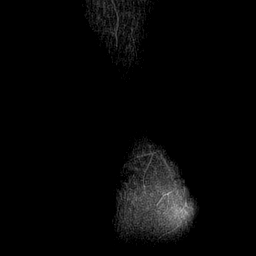

[Series 8: T2 fat-sat · sagittal · 3.0mm · 0.62mm/px · 6 of 33 slices shown (3 of 3)]
[im 1/33]
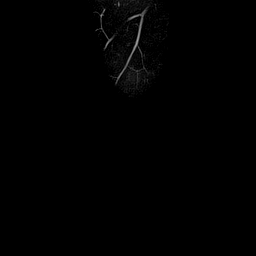
[im 7/33]
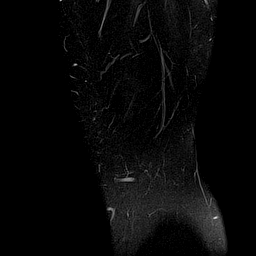
[im 13/33]
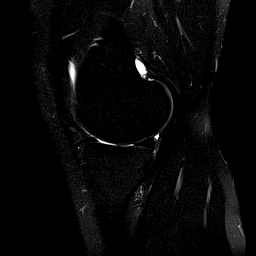
[im 20/33]
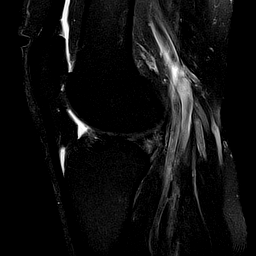
[im 26/33]
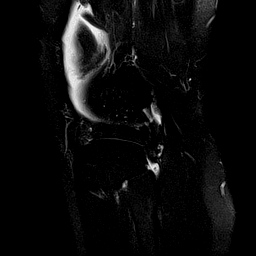
[im 33/33]
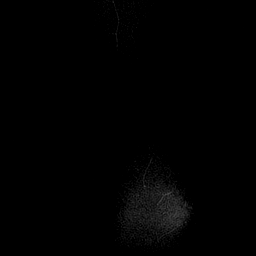

[Series 9: PD fat-sat · oblique · 2.0mm · 0.62mm/px · 4 of 19 slices shown (3 of 3)]
[im 1/19]
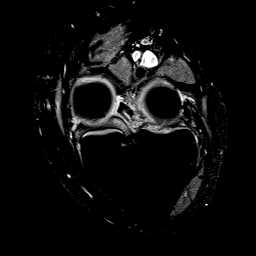
[im 7/19]
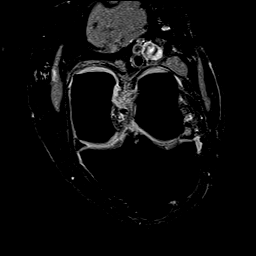
[im 13/19]
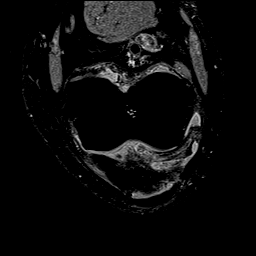
[im 19/19]
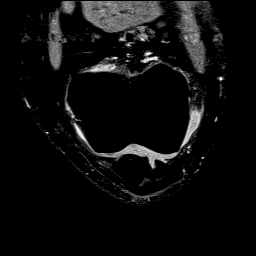

[40 of 40 positions shown; findings below may reference images not displayed]

FINDINGS: MENISCI

Medial meniscus: Prominent degenerative signal within the posterior
horn and root. Suspected small tear at the body/posterior horn
junction with 8 x 4 x 10 mm parameniscal cyst (series 3, image 18;
series 7, image 10).

Lateral meniscus: Prominent degenerative signal throughout the
lateral meniscus with small radial and horizontal tear of the
posterior horn (series 7, images 22 and 23).

LIGAMENTS

Cruciates:  Intact ACL and PCL.

Collaterals: Medial collateral ligament is intact. Lateral
collateral ligament complex is intact.

CARTILAGE

Patellofemoral: Superficial fissuring over the patellar apex. No
focal defect.

Medial: Partial-thickness cartilage loss over the posterior
weight-bearing medial femoral condyle.

Lateral:  No chondral defect.

Joint: Small joint effusion. Normal Hoffa's fat. No plical
thickening.

Popliteal Fossa:  No Baker cyst. Intact popliteus tendon.

Extensor Mechanism: Intact quadriceps tendon and patellar tendon.
Intact medial and lateral patellar retinaculum. Intact MPFL.

Bones: No acute fracture or dislocation. No suspicious bone lesion.
Small intraosseous ganglion cyst in the posterior tibial spine near
the attachment of the medial meniscus posterior root.

Other: None.
IMPRESSION: 1. Small complex tear of the lateral meniscus posterior horn.
2. Suspected small tear at the medial meniscus body/posterior horn
junction with 10 mm parameniscal cyst.
3. Small joint effusion.

## 2020-12-16 DIAGNOSIS — F329 Major depressive disorder, single episode, unspecified: Secondary | ICD-10-CM | POA: Diagnosis not present

## 2020-12-16 DIAGNOSIS — F900 Attention-deficit hyperactivity disorder, predominantly inattentive type: Secondary | ICD-10-CM | POA: Diagnosis not present

## 2020-12-16 DIAGNOSIS — F411 Generalized anxiety disorder: Secondary | ICD-10-CM | POA: Diagnosis not present

## 2020-12-16 DIAGNOSIS — F410 Panic disorder [episodic paroxysmal anxiety] without agoraphobia: Secondary | ICD-10-CM | POA: Diagnosis not present

## 2021-02-26 DIAGNOSIS — N760 Acute vaginitis: Secondary | ICD-10-CM | POA: Diagnosis not present

## 2021-02-26 DIAGNOSIS — B9689 Other specified bacterial agents as the cause of diseases classified elsewhere: Secondary | ICD-10-CM | POA: Diagnosis not present

## 2021-03-01 DIAGNOSIS — F411 Generalized anxiety disorder: Secondary | ICD-10-CM | POA: Diagnosis not present

## 2021-03-01 DIAGNOSIS — F900 Attention-deficit hyperactivity disorder, predominantly inattentive type: Secondary | ICD-10-CM | POA: Diagnosis not present

## 2021-04-07 ENCOUNTER — Telehealth: Payer: BC Managed Care – PPO | Admitting: Physician Assistant

## 2021-04-07 DIAGNOSIS — N898 Other specified noninflammatory disorders of vagina: Secondary | ICD-10-CM

## 2021-04-07 NOTE — Progress Notes (Signed)
Based on what you shared with me, I feel your condition warrants further evaluation and I recommend that you be seen for a face to face visit.  Please contact your primary care physician practice to be seen. Many offices offer virtual options to be seen via video if you are not comfortable going in person to a medical facility at this time.  I can see when you were seen at Urgent Care and given metronidazole for BV that you had already been on a few doses of Diflucan and if you are still dealing with itching and dryness, you need to be seen in person for repeat vaginal examination and swabbing to determine cause of symptoms as the itching could be a direct effect from vaginal dryness you have been noting and not any ongoing infection.   NOTE: You will NOT be charged for this eVisit.  If you do not have a PCP, [facility] offers a free physician referral service available at [phone]. Our trained staff has the experience, knowledge and resources to put you in touch with a physician who is right for you.    If you are having a true medical emergency please call 911.   Your e-visit answers were reviewed by a board certified advanced clinical practitioner to complete your personal care plan.  Thank you for using e-Visits.

## 2021-04-08 NOTE — Progress Notes (Incomplete)
Gethsemane Fischler is a 35 y.o. female here for a vaginal itching and dryness.   SCRIBE STATEMENT  History of Present Illness:   No chief complaint on file.   HPI  Vaginitis   ***  Past Medical History:  Diagnosis Date   History of chickenpox      Social History   Tobacco Use   Smoking status: Former    Types: Cigarettes    Quit date: 2010    Years since quitting: 12.8   Smokeless tobacco: Never  Vaping Use   Vaping Use: Never used  Substance Use Topics   Alcohol use: Yes    Comment: 3-4 glasses of wine   Drug use: Not Currently    Past Surgical History:  Procedure Laterality Date   KNEE ARTHROSCOPY W/ MENISCAL REPAIR Left 12/12/2019    Family History  Problem Relation Age of Onset   Multiple sclerosis Mother    COPD Father    Cancer Father        Lung   Heart attack Father    Cancer Paternal Aunt        Breast   Multiple sclerosis Paternal Aunt    Cancer Paternal Aunt        Breast    Allergies  Allergen Reactions   Adhesive [Tape] Rash    Current Medications:   Current Outpatient Medications:    ALPRAZolam (XANAX) 1 MG tablet, Take 1 tablet by mouth daily as needed., Disp: , Rfl: 2   amphetamine-dextroamphetamine (ADDERALL) 30 MG tablet, Take 1 tablet by mouth 4 (four) times daily., Disp: , Rfl:    azithromycin (ZITHROMAX Z-PAK) 250 MG tablet, Take 2 tablets ( total of 500 mg) PO on day 1, then 1 tablet ( total of 250 mg) PO q24 x 4 days., Disp: 6 each, Rfl: 0   benzonatate (TESSALON) 100 MG capsule, Take 1 capsule (100 mg total) by mouth 3 (three) times daily as needed for cough., Disp: 60 capsule, Rfl: 1   cyanocobalamin (,VITAMIN B-12,) 1000 MCG/ML injection, INJECT VIT B12 ONCE A WEEK X 3 WEEKS, THEN ONCE A MONTH X 3 MONTHS., Disp: 1 mL, Rfl: 5   lamoTRIgine (LAMICTAL) 100 MG tablet, Take 100 mg by mouth daily., Disp: , Rfl:    lamoTRIgine (LAMICTAL) 25 MG tablet, Take 25 mg by mouth daily. , Disp: , Rfl:    Melatonin 5 MG CAPS, Take 2 capsules  by mouth at bedtime., Disp: , Rfl:    metFORMIN (GLUCOPHAGE) 500 MG tablet, Take 1 tablet (500 mg total) by mouth 2 (two) times daily with a meal., Disp: 180 tablet, Rfl: 3   PARAGARD INTRAUTERINE COPPER IUD IUD, by Intrauterine route., Disp: , Rfl:    spironolactone (ALDACTONE) 100 MG tablet, Take 1 tablet (100 mg total) by mouth daily., Disp: 90 tablet, Rfl: 3   SYRINGE-NEEDLE, DISP, 3 ML (B-D 3CC LUER-LOK SYR 25GX1") 25G X 1" 3 ML MISC, USE TO INJECT VIT B12 INJECTIONS, Disp: 6 each, Rfl: 0   traZODone (DESYREL) 100 MG tablet, Take 1 tablet by mouth at bedtime., Disp: , Rfl: 0   Review of Systems:   ROS Negative unless otherwise specified per HPI.  Vitals:   There were no vitals filed for this visit.   There is no height or weight on file to calculate BMI.  Physical Exam:   Physical Exam  Assessment and Plan:        I,[name],acting as a scribe for [employer], PA.,have documented all  relevant documentation on the behalf of [name], PA,as directed by  [name], PA while in the presence of [place], [place].  ***  [name], [place]

## 2021-04-09 ENCOUNTER — Other Ambulatory Visit: Payer: Self-pay | Admitting: *Deleted

## 2021-04-09 ENCOUNTER — Ambulatory Visit (INDEPENDENT_AMBULATORY_CARE_PROVIDER_SITE_OTHER): Payer: BC Managed Care – PPO | Admitting: Physician Assistant

## 2021-04-09 ENCOUNTER — Encounter: Payer: Self-pay | Admitting: Physician Assistant

## 2021-04-09 ENCOUNTER — Ambulatory Visit: Payer: BC Managed Care – PPO | Admitting: Physician Assistant

## 2021-04-09 ENCOUNTER — Other Ambulatory Visit (HOSPITAL_COMMUNITY)
Admission: RE | Admit: 2021-04-09 | Discharge: 2021-04-09 | Disposition: A | Discharge status: Home / Self Care | Payer: BC Managed Care – PPO | Source: Ambulatory Visit | Attending: Physician Assistant | Admitting: Physician Assistant

## 2021-04-09 ENCOUNTER — Other Ambulatory Visit: Payer: Self-pay

## 2021-04-09 VITALS — BP 110/80 | HR 81 | Temp 98.6°F | Ht 68.5 in | Wt 175.5 lb

## 2021-04-09 DIAGNOSIS — E538 Deficiency of other specified B group vitamins: Secondary | ICD-10-CM | POA: Diagnosis not present

## 2021-04-09 DIAGNOSIS — N898 Other specified noninflammatory disorders of vagina: Secondary | ICD-10-CM

## 2021-04-09 DIAGNOSIS — R21 Rash and other nonspecific skin eruption: Secondary | ICD-10-CM | POA: Diagnosis not present

## 2021-04-09 LAB — VITAMIN B12: Vitamin B-12: 237 pg/mL (ref 211–911)

## 2021-04-09 MED ORDER — KETOCONAZOLE 2 % EX CREA: TOPICAL_CREAM | CUTANEOUS | 0 refills | Status: DC

## 2021-04-09 NOTE — Patient Instructions (Signed)
It was great to see you!  Trial the vaginal moisturizer Go ahead and reach out to your gynecologist to follow-up with them  Use the topical ketoconazole ointment in your armpit area Referral for dermatology placed today  Update B12 and we will provide recommendations according.y  Let's follow-up as needed.  If a referral was placed today, you will be contacted for an appointment. Please note that routine referrals can sometimes take up to 3-4 weeks to process. Please call our office if you haven't heard anything after this time frame.  Take care,  [name] PA-C

## 2021-04-09 NOTE — Progress Notes (Addendum)
Veronica Ramirez is a 35 y.o. female here for vaginal itching and dryness.   History of Present Illness:   Chief Complaint  Patient presents with   vaginal dryness    Pt c/o vaginal dryness and slight itching externally.   HPI  Vaginal Dryness On 02/26/21, patient visited UC with c/o a white vaginal discharge and odor that had been onset for 2 weeks.  At that time she tried taking diflucan which provided no relief. Denied burning or vaginal itching at that time. She was dx with BV and prescribed flagyl 500 mg.This proved helpful for about 3 weeks until sx of vaginal dryness presented itself.   Veronica Ramirez presents today experiencing vaginal dryness with associated slight external itching. Pt has a hx of yeast infections, but doesn't believe her current sx to be similar to past infections. Veronica Ramirez has stated that having intercourse has become difficult due to vaginal dryness. Denies abnormal discharge, urinary symptoms, dry eyes, dry mouth, tampon use, or decreased water intake.   Axilla/ Pelvic Odor and Axillary Rash Last year Veronica Ramirez noticed an abnormal odor from her left axilla and pelvic. She was referred to a dermatologist to remedy the issue, but was provided with no relief. At the time she was prescribed a medical deodorant that assisted with her axilla odor. Other than that, [name] reports being told it was "all in her head". Since that visit, her sx have worsened and  developed into an erythematous rash on her right axilla region. Veronica Ramirez is open to an additional referral to a dermatologist to investigated the issue further.   B12 Deficiency Last B12 checked in April 2022 was 210. She did B12 injections and tolerated well. Interested in recheck today.   Past Medical History:  Diagnosis Date   History of chickenpox      Social History   Tobacco Use   Smoking status: Former    Types: Cigarettes    Quit date: 2010    Years since quitting: 12.8   Smokeless tobacco: Never   Vaping Use   Vaping Use: Never used  Substance Use Topics   Alcohol use: Yes    Comment: 3-4 glasses of wine   Drug use: Not Currently    Past Surgical History:  Procedure Laterality Date   KNEE ARTHROSCOPY W/ MENISCAL REPAIR Left 12/12/2019    Family History  Problem Relation Age of Onset   Multiple sclerosis Mother    COPD Father    Cancer Father        Lung   Heart attack Father    Cancer Paternal Aunt        Breast   Multiple sclerosis Paternal Aunt    Cancer Paternal Aunt        Breast    Allergies  Allergen Reactions   Adhesive [Tape] Rash    Current Medications:   Current Outpatient Medications:    ALPRAZolam (XANAX) 1 MG tablet, Take 1 tablet by mouth daily as needed., Disp: , Rfl: 2   amphetamine-dextroamphetamine (ADDERALL) 30 MG tablet, Take 1 tablet by mouth 4 (four) times daily., Disp: , Rfl:    ketoconazole (NIZORAL) 2 % cream, Apply to affected area 1-2 times daily, Disp: 15 g, Rfl: 0   lamoTRIgine (LAMICTAL) 100 MG tablet, Take 100 mg by mouth daily., Disp: , Rfl:    lamoTRIgine (LAMICTAL) 25 MG tablet, Take 25 mg by mouth daily. , Disp: , Rfl:    Melatonin 5 MG CAPS, Take 2 capsules by mouth at  bedtime., Disp: , Rfl:    metFORMIN (GLUCOPHAGE) 500 MG tablet, Take 1 tablet (500 mg total) by mouth 2 (two) times daily with a meal., Disp: 180 tablet, Rfl: 3   PARAGARD INTRAUTERINE COPPER IUD IUD, by Intrauterine route., Disp: , Rfl:    spironolactone (ALDACTONE) 100 MG tablet, Take 1 tablet (100 mg total) by mouth daily., Disp: 90 tablet, Rfl: 3   SYRINGE-NEEDLE, DISP, 3 ML (B-D 3CC LUER-LOK SYR 25GX1") 25G X 1" 3 ML MISC, USE TO INJECT VIT B12 INJECTIONS, Disp: 6 each, Rfl: 0   traZODone (DESYREL) 100 MG tablet, Take 1 tablet by mouth at bedtime., Disp: , Rfl: 0   Review of Systems:   ROS Negative unless otherwise specified per HPI.  Vitals:   Vitals:   04/09/21 0819  BP: 110/80  Pulse: 81  Temp: 98.6 F (37 C)  TempSrc: Temporal  SpO2: 98%   Weight: 175 lb 8 oz (79.6 kg)  Height: 5' 8.5" (1.74 m)     Body mass index is 26.3 kg/m.  Physical Exam:   Physical Exam Vitals and nursing note reviewed.  Constitutional:      General: She is not in acute distress.    Appearance: She is well-developed. She is not ill-appearing or toxic-appearing.  Cardiovascular:     Rate and Rhythm: Normal rate and regular rhythm.     Pulses: Normal pulses.     Heart sounds: Normal heart sounds, S1 normal and S2 normal.  Pulmonary:     Effort: Pulmonary effort is normal.     Breath sounds: Normal breath sounds.  Genitourinary:    Labia:        Right: No rash or tenderness.        Left: No rash or tenderness.      Vagina: Normal.     Cervix: Normal.  Skin:    General: Skin is warm and dry.     Comments: Well circumscribed erythematous rash to R axilla  Neurological:     Mental Status: She is alert.     GCS: GCS eye subscore is 4. GCS verbal subscore is 5. GCS motor subscore is 6.  Psychiatric:        Speech: Speech normal.        Behavior: Behavior normal. Behavior is cooperative.    Assessment and Plan:   Rash Suspect possible tinea Will trial topical ketoconazole ointment Referral to dermatology Follow-up as needed  B12 deficiency Update B12 today and provide recommendations accordingly  Vaginal dryness No red flags Cervical swab obtained -- will treat based on results Trial topical replens moisturizer Recommend follow-up with ob-gyn for further evaluation and management  I,Veronica Ramirez,acting as a scribe for Veronica Coke, PA.,have documented all relevant documentation on the behalf of Veronica Coke, PA,as directed by  Veronica Coke, PA while in the presence of Veronica Ramirez, [place].  I, Veronica Ramirez, [place], have reviewed all documentation for this visit. The documentation on 04/09/21 for the exam, diagnosis, procedures, and orders are all accurate and complete.  Veronica Coke, PA-C

## 2021-04-12 ENCOUNTER — Other Ambulatory Visit: Payer: Self-pay | Admitting: Physician Assistant

## 2021-04-12 LAB — CERVICOVAGINAL ANCILLARY ONLY
Bacterial Vaginitis (gardnerella): POSITIVE — AB
Candida Glabrata: NEGATIVE
Candida Vaginitis: POSITIVE — AB
Chlamydia: NEGATIVE
Comment: NEGATIVE
Comment: NEGATIVE
Comment: NEGATIVE
Comment: NEGATIVE
Comment: NEGATIVE
Comment: NORMAL
Neisseria Gonorrhea: NEGATIVE
Trichomonas: NEGATIVE

## 2021-04-12 MED ORDER — CYANOCOBALAMIN 1000 MCG/ML IJ SOLN: INTRAMUSCULAR | 0 refills | Status: DC

## 2021-04-12 MED ORDER — METRONIDAZOLE 500 MG PO TABS: 500.0000 mg | ORAL_TABLET | Freq: Two times a day (BID) | ORAL | 0 refills | Status: AC

## 2021-04-12 MED ORDER — FLUCONAZOLE 150 MG PO TABS: 150.0000 mg | ORAL_TABLET | Freq: Once | ORAL | 0 refills | Status: AC

## 2021-04-13 ENCOUNTER — Encounter: Payer: Self-pay | Admitting: Physician Assistant

## 2021-04-16 NOTE — Progress Notes (Incomplete)
Veronica Ramirez is a 35 y.o. female here for a pap smear.  SCRIBE STATEMENT  History of Present Illness:   No chief complaint on file.   HPI  Past Medical History:  Diagnosis Date   History of chickenpox      Social History   Tobacco Use   Smoking status: Former    Types: Cigarettes    Quit date: 2010    Years since quitting: 12.8   Smokeless tobacco: Never  Vaping Use   Vaping Use: Never used  Substance Use Topics   Alcohol use: Yes    Comment: 3-4 glasses of wine   Drug use: Not Currently    Past Surgical History:  Procedure Laterality Date   KNEE ARTHROSCOPY W/ MENISCAL REPAIR Left 12/12/2019    Family History  Problem Relation Age of Onset   Multiple sclerosis Mother    COPD Father    Cancer Father        Lung   Heart attack Father    Cancer Paternal Aunt        Breast   Multiple sclerosis Paternal Aunt    Cancer Paternal Aunt        Breast    Allergies  Allergen Reactions   Adhesive [Tape] Rash    Current Medications:   Current Outpatient Medications:    ALPRAZolam (XANAX) 1 MG tablet, Take 1 tablet by mouth daily as needed., Disp: , Rfl: 2   amphetamine-dextroamphetamine (ADDERALL) 30 MG tablet, Take 1 tablet by mouth 4 (four) times daily., Disp: , Rfl:    cyanocobalamin (,VITAMIN B-12,) 1000 MCG/ML injection, Inject 1 ml into muscle monthly, Disp: 10 mL, Rfl: 0   ketoconazole (NIZORAL) 2 % cream, Apply to affected area 1-2 times daily, Disp: 15 g, Rfl: 0   lamoTRIgine (LAMICTAL) 100 MG tablet, Take 100 mg by mouth daily., Disp: , Rfl:    lamoTRIgine (LAMICTAL) 25 MG tablet, Take 25 mg by mouth daily. , Disp: , Rfl:    Melatonin 5 MG CAPS, Take 2 capsules by mouth at bedtime., Disp: , Rfl:    metFORMIN (GLUCOPHAGE) 500 MG tablet, Take 1 tablet (500 mg total) by mouth 2 (two) times daily with a meal., Disp: 180 tablet, Rfl: 3   metroNIDAZOLE (FLAGYL) 500 MG tablet, Take 1 tablet (500 mg total) by mouth 2 (two) times daily for 7 days., Disp: 14  tablet, Rfl: 0   PARAGARD INTRAUTERINE COPPER IUD IUD, by Intrauterine route., Disp: , Rfl:    spironolactone (ALDACTONE) 100 MG tablet, Take 1 tablet (100 mg total) by mouth daily., Disp: 90 tablet, Rfl: 3   SYRINGE-NEEDLE, DISP, 3 ML (B-D 3CC LUER-LOK SYR 25GX1") 25G X 1" 3 ML MISC, USE TO INJECT VIT B12 INJECTIONS, Disp: 6 each, Rfl: 0   traZODone (DESYREL) 100 MG tablet, Take 1 tablet by mouth at bedtime., Disp: , Rfl: 0   Review of Systems:   ROS Negative unless otherwise specified per HPI. Vitals:   There were no vitals filed for this visit.   There is no height or weight on file to calculate BMI.  Physical Exam:   Physical Exam  Assessment and Plan:       I,[name],acting as a scribe for [name], PA.,have documented all relevant documentation on the behalf of [name], PA,as directed by  [name], PA while in the presence of [name], [place].   ***  [name], PA-C

## 2021-04-19 ENCOUNTER — Ambulatory Visit: Payer: BC Managed Care – PPO | Admitting: Physician Assistant

## 2021-04-29 DIAGNOSIS — J029 Acute pharyngitis, unspecified: Secondary | ICD-10-CM | POA: Diagnosis not present

## 2021-04-29 DIAGNOSIS — R059 Cough, unspecified: Secondary | ICD-10-CM | POA: Diagnosis not present

## 2021-04-29 DIAGNOSIS — R0981 Nasal congestion: Secondary | ICD-10-CM | POA: Diagnosis not present

## 2021-04-29 DIAGNOSIS — R52 Pain, unspecified: Secondary | ICD-10-CM | POA: Diagnosis not present

## 2021-05-09 DIAGNOSIS — J101 Influenza due to other identified influenza virus with other respiratory manifestations: Secondary | ICD-10-CM | POA: Diagnosis not present

## 2021-05-17 ENCOUNTER — Ambulatory Visit: Payer: BC Managed Care – PPO | Admitting: Obstetrics & Gynecology

## 2021-06-04 ENCOUNTER — Encounter: Payer: Self-pay | Admitting: Physician Assistant

## 2021-06-04 ENCOUNTER — Telehealth (INDEPENDENT_AMBULATORY_CARE_PROVIDER_SITE_OTHER): Payer: BC Managed Care – PPO | Admitting: Physician Assistant

## 2021-06-04 DIAGNOSIS — M21612 Bunion of left foot: Secondary | ICD-10-CM | POA: Diagnosis not present

## 2021-06-04 DIAGNOSIS — M21611 Bunion of right foot: Secondary | ICD-10-CM

## 2021-06-04 DIAGNOSIS — R21 Rash and other nonspecific skin eruption: Secondary | ICD-10-CM | POA: Diagnosis not present

## 2021-06-04 MED ORDER — NYSTATIN 100000 UNIT/GM EX CREA: TOPICAL_CREAM | CUTANEOUS | 0 refills | Status: DC

## 2021-06-04 NOTE — Progress Notes (Signed)
TELEPHONE ENCOUNTER   Patient verbally agreed to telephone visit and is aware that copayment and coinsurance may apply. Patient was treated using telemedicine according to accepted telemedicine protocols.  Location of the patient: home Location of provider: [facility] Names of all persons participating in the telemedicine service and role in the encounter: [name], PA , Viviana Simpler  Subjective:   Chief Complaint  Patient presents with   Bunions     HPI   Foot pain Veronica Ramirez is a 35 y.o. who identifies as a female who was assigned female at birth, and is being seen today for Bunions. Pt c/o bilateral bunions right worse than left. Right foot blisters between great toe and 2nd toe. Pt would like to discuss treatment. Has crooked toes. Has to tape and bandage toes if walking for prolonged periods of time to prevent significant blistering.  Axilla rash Saw me for this on 04/09/21. We prescribed ketoconazole and she had about 50% improvement of symptoms.  She had some nystatin cream at home and applied it to the area and had 100% relief of symptoms.  She would like a refill of this cream if possible today.  Denies any further concerns regarding her rash.  Patient Active Problem List   Diagnosis Date Noted   Mood swings 09/30/2020   Interstitial cystitis 09/30/2020   Attention deficit hyperactivity disorder (ADHD), predominantly hyperactive type 05/30/2018   PCOS (polycystic ovarian syndrome) 05/30/2018   Obesity, Class I, BMI 30-34.9 08/18/2014   Social History   Tobacco Use   Smoking status: Former    Types: Cigarettes    Quit date: 2010    Years since quitting: 12.9   Smokeless tobacco: Never  Substance Use Topics   Alcohol use: Yes    Comment: 3-4 glasses of wine    Current Outpatient Medications:    ALPRAZolam (XANAX) 1 MG tablet, Take 1 tablet by mouth daily as needed., Disp: , Rfl: 2   amphetamine-dextroamphetamine (ADDERALL) 30 MG tablet, Take 1  tablet by mouth 4 (four) times daily., Disp: , Rfl:    cyanocobalamin (,VITAMIN B-12,) 1000 MCG/ML injection, Inject 1 ml into muscle monthly, Disp: 10 mL, Rfl: 0   ketoconazole (NIZORAL) 2 % cream, Apply to affected area 1-2 times daily, Disp: 15 g, Rfl: 0   lamoTRIgine (LAMICTAL) 100 MG tablet, Take 100 mg by mouth daily., Disp: , Rfl:    lamoTRIgine (LAMICTAL) 25 MG tablet, Take 25 mg by mouth daily. , Disp: , Rfl:    Melatonin 5 MG CAPS, Take 2 capsules by mouth at bedtime., Disp: , Rfl:    metFORMIN (GLUCOPHAGE) 500 MG tablet, Take 1 tablet (500 mg total) by mouth 2 (two) times daily with a meal., Disp: 180 tablet, Rfl: 3   nystatin cream (MYCOSTATIN), Apply to affected 1-2 times daily, Disp: 30 g, Rfl: 0   PARAGARD INTRAUTERINE COPPER IUD IUD, by Intrauterine route., Disp: , Rfl:    spironolactone (ALDACTONE) 100 MG tablet, Take 1 tablet (100 mg total) by mouth daily., Disp: 90 tablet, Rfl: 3   SYRINGE-NEEDLE, DISP, 3 ML (B-D 3CC LUER-LOK SYR 25GX1") 25G X 1" 3 ML MISC, USE TO INJECT VIT B12 INJECTIONS, Disp: 6 each, Rfl: 0   traZODone (DESYREL) 100 MG tablet, Take 1 tablet by mouth at bedtime., Disp: , Rfl: 0 Allergies  Allergen Reactions   Adhesive [Tape] Rash    Assessment & Plan:   1. Bilateral bunions  Referral to podiatry for further evaluation management  Declines  any immediate specific needs at this time  2. Rash  Improved Refill nystatin cream for patient Follow-up with [place] as needed    Orders Placed This Encounter  Procedures   Ambulatory referral to Podiatry   Meds ordered this encounter  Medications   nystatin cream (MYCOSTATIN)    Sig: Apply to affected 1-2 times daily    Dispense:  30 g    Refill:  0    Order Specific Question:   Supervising Provider    Answer:   Vivi Barrack [[id number]]    [id number] GNFAOZ, PA 06/04/2021  Time spent with the patient: 8 minutes, spent in obtaining information about her symptoms, reviewing her previous labs, evaluations,  and treatments, counseling her about her condition (please see the discussed topics above), and developing a plan to further investigate it; she had a number of questions which I addressed.

## 2021-06-09 NOTE — Progress Notes (Deleted)
° °  I, Peterson Lombard, LAT, ATC acting as a scribe for Lynne Leader, MD.  Veronica Ramirez is a 35 y.o. female who presents to [place] at [facility] today for re-injury of her L knee. She has a hx of L knee meniscal surgery in 2007-08 while living in [place]. Pt was last seen by Dr. Georgina Snell on 11/26/19 and was prescribed Meloxicam and sent for a MRI and referred for a surgical consultation. Today, pt reports  Dx imaging: 12/01/19 L knee MRI  09/10/19 L knee XR  Pertinent review of systems: ***  Relevant historical information: ***   Exam:  LMP 05/31/2021  General: Well Developed, well nourished, and in no acute distress.   MSK: ***    Lab and Radiology Results No results found for this or any previous visit (from the past 72 hour(s)). No results found.     Assessment and Plan: 35 y.o. female with ***   PDMP not reviewed this encounter. No orders of the defined types were placed in this encounter.  No orders of the defined types were placed in this encounter.    Discussed warning signs or symptoms. Please see discharge instructions. Patient expresses understanding.   ***

## 2021-06-10 ENCOUNTER — Ambulatory Visit: Payer: BC Managed Care – PPO | Admitting: Family Medicine

## 2021-06-10 ENCOUNTER — Ambulatory Visit (INDEPENDENT_AMBULATORY_CARE_PROVIDER_SITE_OTHER): Payer: BC Managed Care – PPO | Admitting: Podiatry

## 2021-06-10 ENCOUNTER — Other Ambulatory Visit: Payer: Self-pay

## 2021-06-10 ENCOUNTER — Ambulatory Visit (INDEPENDENT_AMBULATORY_CARE_PROVIDER_SITE_OTHER): Payer: BC Managed Care – PPO

## 2021-06-10 DIAGNOSIS — M79672 Pain in left foot: Secondary | ICD-10-CM

## 2021-06-10 DIAGNOSIS — M21611 Bunion of right foot: Secondary | ICD-10-CM

## 2021-06-10 DIAGNOSIS — M79671 Pain in right foot: Secondary | ICD-10-CM

## 2021-06-10 DIAGNOSIS — M21612 Bunion of left foot: Secondary | ICD-10-CM | POA: Diagnosis not present

## 2021-06-10 NOTE — Patient Instructions (Signed)

## 2021-06-16 NOTE — Progress Notes (Signed)
Subjective:   Patient ID: Veronica Ramirez, female   DOB: 35 y.o.   MRN: [id number]   HPI 35 year old female presents the office today for concerns of bilateral bunions with the right foot worse than the left. She has always been painful with swelling worse for the last 2 years.  She tried changing shoes without any significant improvement in the point he did get worse and become more symptomatic.  She was discussed other treatment options at this point.   Review of Systems  All other systems reviewed and are negative.  Past Medical History:  Diagnosis Date   History of chickenpox     Past Surgical History:  Procedure Laterality Date   KNEE ARTHROSCOPY W/ MENISCAL REPAIR Left 12/12/2019     Current Outpatient Medications:    ALPRAZolam (XANAX) 1 MG tablet, Take 1 tablet by mouth daily as needed., Disp: , Rfl: 2   amphetamine-dextroamphetamine (ADDERALL) 30 MG tablet, Take 1 tablet by mouth 4 (four) times daily., Disp: , Rfl:    cyanocobalamin (,VITAMIN B-12,) 1000 MCG/ML injection, Inject 1 ml into muscle monthly, Disp: 10 mL, Rfl: 0   ketoconazole (NIZORAL) 2 % cream, Apply to affected area 1-2 times daily, Disp: 15 g, Rfl: 0   lamoTRIgine (LAMICTAL) 100 MG tablet, Take 100 mg by mouth daily., Disp: , Rfl:    lamoTRIgine (LAMICTAL) 25 MG tablet, Take 25 mg by mouth daily. , Disp: , Rfl:    Melatonin 5 MG CAPS, Take 2 capsules by mouth at bedtime., Disp: , Rfl:    metFORMIN (GLUCOPHAGE) 500 MG tablet, Take 1 tablet (500 mg total) by mouth 2 (two) times daily with a meal., Disp: 180 tablet, Rfl: 3   nystatin cream (MYCOSTATIN), Apply to affected 1-2 times daily, Disp: 30 g, Rfl: 0   PARAGARD INTRAUTERINE COPPER IUD IUD, by Intrauterine route., Disp: , Rfl:    spironolactone (ALDACTONE) 100 MG tablet, Take 1 tablet (100 mg total) by mouth daily., Disp: 90 tablet, Rfl: 3   SYRINGE-NEEDLE, DISP, 3 ML (B-D 3CC LUER-LOK SYR 25GX1") 25G X 1" 3 ML MISC, USE TO INJECT VIT B12 INJECTIONS, Disp:  6 each, Rfl: 0   traZODone (DESYREL) 100 MG tablet, Take 1 tablet by mouth at bedtime., Disp: , Rfl: 0  Allergies  Allergen Reactions   Adhesive [Tape] Rash          Objective:  Physical Exam  General: AAO x3, NAD  Dermatological: Skin is warm, dry and supple bilateral. There are no open sores, no preulcerative lesions, no rash or signs of infection present.  Vascular: Dorsalis Pedis artery and Posterior Tibial artery pedal pulses are 2/4 bilateral with immedate capillary fill time.  There is no pain with calf compression, swelling, warmth, erythema.   Neruologic: Grossly intact via light touch bilateral.   Musculoskeletal: Moderate bunions are present with the right side more tender than the left.  There is mild erythema on the medial first metatarsal head from irritation side shoes without any skin breakdown, warmth.  No pain or crepitation or restriction with first week of range of motion.  Muscular strength 5/5 in all groups tested bilateral.  Gait: Unassisted, Nonantalgic.       Assessment:   Bilateral symptomatic bunions right foot worse than the left     Plan:  -Treatment options discussed including all alternatives, risks, and complications -Etiology of symptoms were discussed -X-rays were obtained and reviewed with the patient.  Moderate bunions are present with increased intermetatarsal as well as  hallux abductus angle. -We discussed with conservative as well as surgical treatment options.  Conservatively discussed different shoes, offloading, padding as well as topical anti-inflammatories as needed.  Surgically we discussed bunionectomy.  She does want to have the surgery performed but she did not have her knee evaluated prior to this.  Continue conservative care for now she desires to proceed with surgery to let us know.  Trula Slade DPM

## 2021-06-26 DIAGNOSIS — M25561 Pain in right knee: Secondary | ICD-10-CM | POA: Diagnosis not present

## 2021-07-02 DIAGNOSIS — M25561 Pain in right knee: Secondary | ICD-10-CM | POA: Diagnosis not present

## 2021-07-07 DIAGNOSIS — M25561 Pain in right knee: Secondary | ICD-10-CM | POA: Diagnosis not present

## 2021-07-22 ENCOUNTER — Telehealth (INDEPENDENT_AMBULATORY_CARE_PROVIDER_SITE_OTHER): Payer: BC Managed Care – PPO | Admitting: Physician Assistant

## 2021-07-22 ENCOUNTER — Other Ambulatory Visit: Payer: Self-pay

## 2021-07-22 ENCOUNTER — Encounter: Payer: Self-pay | Admitting: Physician Assistant

## 2021-07-22 VITALS — Ht 68.5 in | Wt 179.0 lb

## 2021-07-22 DIAGNOSIS — N898 Other specified noninflammatory disorders of vagina: Secondary | ICD-10-CM | POA: Diagnosis not present

## 2021-07-22 MED ORDER — FLUCONAZOLE 150 MG PO TABS: ORAL_TABLET | ORAL | 0 refills | Status: DC

## 2021-07-22 NOTE — Progress Notes (Signed)
I acted as a Education administrator for [employer], PA-C Anselmo Pickler, LPN  Virtual Visit via Video Note   I, , connected with  Veronica Ramirez  ([id number], 28-Dec-1985) on 07/22/21 at  8:00 AM EST by a video-enabled telemedicine application and verified that I am speaking with the correct person using two identifiers.  Location: Patient: Home Provider: [place] office   I discussed the limitations of evaluation and management by telemedicine and the availability of in person appointments. The patient expressed understanding and agreed to proceed.    History of Present Illness: Veronica Ramirez is a 36 y.o. who identifies as a female who was assigned female at birth, and is being seen today for vaginal itching. Pt c/o vaginal itching and odor x 2 weeks. She tried Metronidazole that she had left over and this provided no relief of symptoms and then tried monistat one with little relief. No vaginal discharge. Denies fever or chills.  Denies concerns for STDs or pregnancy.  Problems:  Patient Active Problem List   Diagnosis Date Noted   Mood swings 09/30/2020   Interstitial cystitis 09/30/2020   Attention deficit hyperactivity disorder (ADHD), predominantly hyperactive type 05/30/2018   PCOS (polycystic ovarian syndrome) 05/30/2018   Obesity, Class I, BMI 30-34.9 08/18/2014    Allergies:  Allergies  Allergen Reactions   Adhesive [Tape] Rash   Medications:  Current Outpatient Medications:    fluconazole (DIFLUCAN) 150 MG tablet, Take 1 tablet by mouth, may repeat again in 3 days if needed., Disp: 2 tablet, Rfl: 0   ALPRAZolam (XANAX) 1 MG tablet, Take 1 tablet by mouth daily as needed., Disp: , Rfl: 2   amphetamine-dextroamphetamine (ADDERALL) 30 MG tablet, Take 1 tablet by mouth 4 (four) times daily., Disp: , Rfl:    cyanocobalamin (,VITAMIN B-12,) 1000 MCG/ML injection, Inject 1 ml into muscle monthly, Disp: 10 mL, Rfl: 0   ketoconazole (NIZORAL) 2 % cream, Apply to  affected area 1-2 times daily, Disp: 15 g, Rfl: 0   lamoTRIgine (LAMICTAL) 100 MG tablet, Take 100 mg by mouth daily., Disp: , Rfl:    lamoTRIgine (LAMICTAL) 25 MG tablet, Take 25 mg by mouth daily. , Disp: , Rfl:    Melatonin 5 MG CAPS, Take 2 capsules by mouth at bedtime., Disp: , Rfl:    metFORMIN (GLUCOPHAGE) 500 MG tablet, Take 1 tablet (500 mg total) by mouth 2 (two) times daily with a meal., Disp: 180 tablet, Rfl: 3   nystatin cream (MYCOSTATIN), Apply to affected 1-2 times daily, Disp: 30 g, Rfl: 0   PARAGARD INTRAUTERINE COPPER IUD IUD, by Intrauterine route., Disp: , Rfl:    spironolactone (ALDACTONE) 100 MG tablet, Take 1 tablet (100 mg total) by mouth daily., Disp: 90 tablet, Rfl: 3   SYRINGE-NEEDLE, DISP, 3 ML (B-D 3CC LUER-LOK SYR 25GX1") 25G X 1" 3 ML MISC, USE TO INJECT VIT B12 INJECTIONS, Disp: 6 each, Rfl: 0   traZODone (DESYREL) 100 MG tablet, Take 1 tablet by mouth at bedtime., Disp: , Rfl: 0  Observations/Objective: Patient is well-developed, well-nourished in no acute distress.  Resting comfortably  at home.  Head is normocephalic, atraumatic.  No labored breathing.  Speech is clear and coherent with logical content.  Patient is alert and oriented at baseline.    Assessment and Plan: 1. Vaginal itching Uncontrolled Suspect yeast infection -- will trial oral diflucan now, may repeat in 3 days if needed I did discuss with her limitations of virtual visit and need for  vaginal swab if no improvement -- she verbalized understanding  Follow Up Instructions: I discussed the assessment and treatment plan with the patient. The patient was provided an opportunity to ask questions and all were answered. The patient agreed with the plan and demonstrated an understanding of the instructions.  A copy of instructions were sent to the patient via MyChart unless otherwise noted below.   The patient was advised to call back or seek an in-person evaluation if the symptoms worsen or if  the condition fails to improve as anticipated.  [name], [place]

## 2021-08-05 DIAGNOSIS — S83251A Bucket-handle tear of lateral meniscus, current injury, right knee, initial encounter: Secondary | ICD-10-CM | POA: Diagnosis not present

## 2021-08-05 DIAGNOSIS — G8918 Other acute postprocedural pain: Secondary | ICD-10-CM | POA: Diagnosis not present

## 2021-08-05 DIAGNOSIS — S83281A Other tear of lateral meniscus, current injury, right knee, initial encounter: Secondary | ICD-10-CM | POA: Diagnosis not present

## 2021-08-05 DIAGNOSIS — M2241 Chondromalacia patellae, right knee: Secondary | ICD-10-CM | POA: Diagnosis not present

## 2021-08-05 DIAGNOSIS — X58XXXA Exposure to other specified factors, initial encounter: Secondary | ICD-10-CM | POA: Diagnosis not present

## 2021-08-05 DIAGNOSIS — Y999 Unspecified external cause status: Secondary | ICD-10-CM | POA: Diagnosis not present

## 2021-08-10 DIAGNOSIS — F900 Attention-deficit hyperactivity disorder, predominantly inattentive type: Secondary | ICD-10-CM | POA: Diagnosis not present

## 2021-08-10 DIAGNOSIS — F419 Anxiety disorder, unspecified: Secondary | ICD-10-CM | POA: Diagnosis not present

## 2021-08-11 DIAGNOSIS — M25661 Stiffness of right knee, not elsewhere classified: Secondary | ICD-10-CM | POA: Diagnosis not present

## 2021-08-11 NOTE — Progress Notes (Signed)
Veronica Ramirez is a 36 y.o. female here for vaginal itching.  History of Present Illness:   Chief Complaint  Patient presents with   Vaginal Itching    Pt c/o vaginal itching and odor x 1 month. No vaginal discharge. Pt said her whole perineal and anus area is itching. She has tried Monistat 1 and 2 rounds of Diflucan.    HPI  Vaginal Itching Veronica Ramirez returns with continued c/o vaginal itching and odor that has now been onset for a month.  Pt was previously seen about this issue by me on 07/22/21.  Following this visit pt was suspected to have a yeast infection and was prescribed Diflucan 150 mg.  Although she was compliant with this medication, her sx have not resolved and in fact have worsened.   Currently expresses she feels a constant widespread itching sensation on her perineal and anal region.In regards to the odor, she has been dealing with this issue for about two years. Dermatology referral in, new patient appointment pending in April.  In addition to the two rounds of Diflucan, she has also tried monistat 1, but this has not provided her with any relief.   Denies changes in environment, changes in feminine/hygenic products, concerns for STDs, lesions, or vaginal discharge.   She is due for PAP smear today, we will complete this as well.  Past Medical History:  Diagnosis Date   History of chickenpox      Social History   Tobacco Use   Smoking status: Former    Types: Cigarettes    Quit date: 2010    Years since quitting: 13.1   Smokeless tobacco: Never  Vaping Use   Vaping Use: Never used  Substance Use Topics   Alcohol use: Yes    Comment: 3-4 glasses of wine   Drug use: Not Currently    Past Surgical History:  Procedure Laterality Date   KNEE ARTHROSCOPY W/ MENISCAL REPAIR Left 12/12/2019    Family History  Problem Relation Age of Onset   Multiple sclerosis Mother    COPD Father    Cancer Father        Lung   Heart attack Father    Cancer Paternal  Aunt        Breast   Multiple sclerosis Paternal Aunt    Cancer Paternal Aunt        Breast    Allergies  Allergen Reactions   Adhesive [Tape] Rash    Current Medications:   Current Outpatient Medications:    ALPRAZolam (XANAX) 1 MG tablet, Take 1 tablet by mouth daily as needed., Disp: , Rfl: 2   amphetamine-dextroamphetamine (ADDERALL) 30 MG tablet, Take 1 tablet by mouth 4 (four) times daily., Disp: , Rfl:    cyanocobalamin (,VITAMIN B-12,) 1000 MCG/ML injection, Inject 1 ml into muscle monthly, Disp: 10 mL, Rfl: 0   lamoTRIgine (LAMICTAL) 100 MG tablet, Take 100 mg by mouth daily., Disp: , Rfl:    lamoTRIgine (LAMICTAL) 25 MG tablet, Take 25 mg by mouth daily. , Disp: , Rfl:    Melatonin 5 MG CAPS, Take 2 capsules by mouth at bedtime., Disp: , Rfl:    metFORMIN (GLUCOPHAGE) 500 MG tablet, Take 1 tablet (500 mg total) by mouth 2 (two) times daily with a meal., Disp: 180 tablet, Rfl: 3   nystatin cream (MYCOSTATIN), Apply to affected 1-2 times daily, Disp: 30 g, Rfl: 0   PARAGARD INTRAUTERINE COPPER IUD IUD, by Intrauterine route., Disp: , Rfl:  spironolactone (ALDACTONE) 100 MG tablet, Take 1 tablet (100 mg total) by mouth daily., Disp: 90 tablet, Rfl: 3   SYRINGE-NEEDLE, DISP, 3 ML (B-D 3CC LUER-LOK SYR 25GX1") 25G X 1" 3 ML MISC, USE TO INJECT VIT B12 INJECTIONS, Disp: 6 each, Rfl: 0   traZODone (DESYREL) 100 MG tablet, Take 1 tablet by mouth at bedtime., Disp: , Rfl: 0   Review of Systems:   ROS Negative unless otherwise specified per HPI.  Vitals:   Vitals:   08/12/21 0856  BP: 106/70  Pulse: 92  Temp: 98.2 F (36.8 C)  TempSrc: Temporal  SpO2: 97%  Weight: 180 lb 6.1 oz (81.8 kg)  Height: 5' 8.5" (1.74 m)     Body mass index is 27.03 kg/m.  Physical Exam:   Physical Exam Vitals and nursing note reviewed. Exam conducted with a chaperone present.  Constitutional:      General: She is not in acute distress.    Appearance: She is well-developed. She is not  ill-appearing or toxic-appearing.  Cardiovascular:     Rate and Rhythm: Normal rate and regular rhythm.     Pulses: Normal pulses.     Heart sounds: Normal heart sounds, S1 normal and S2 normal.  Pulmonary:     Effort: Pulmonary effort is normal.     Breath sounds: Normal breath sounds.  Genitourinary:    General: Normal vulva.     Pubic Area: No rash or pubic lice.      Labia:        Right: No rash, tenderness or lesion.        Left: No rash, tenderness or lesion.      Urethra: No urethral pain, urethral swelling or urethral lesion.     Comments: Thin, white discharge present  Skin:    General: Skin is warm and dry.  Neurological:     Mental Status: She is alert.     GCS: GCS eye subscore is 4. GCS verbal subscore is 5. GCS motor subscore is 6.  Psychiatric:        Speech: Speech normal.        Behavior: Behavior normal. Behavior is cooperative.    Assessment and Plan:   Pap smear for cervical cancer screening Completed today  Vaginal itching No red flags, unclear etiology  Start daily otc antihistamine such as Zyrtec, Claritin, Allegra, or Xyzal x 2 weeks--informed patient ay take longer if this benefits them May trial hydroxyzine 25 mg as needed for breakthrough itching  Apply triamcinolone 0.025% to affected area 1-2 times daily Though not likely, recommended patient discuss different medication options psychiatrist out of an abundance of caution Follow up with dermatology for further evaluation   I,[name],acting as a scribe for [name], PA.,have documented all relevant documentation on the behalf of [name], PA,as directed by  [name], PA while in the presence of [name], [place].  I, [name], [place], have reviewed all documentation for this visit. The documentation on 08/12/21 for the exam, diagnosis, procedures, and orders are all accurate and complete.  [name], PA-C

## 2021-08-12 ENCOUNTER — Other Ambulatory Visit (HOSPITAL_COMMUNITY)
Admission: RE | Admit: 2021-08-12 | Discharge: 2021-08-12 | Disposition: A | Discharge status: Home / Self Care | Payer: BC Managed Care – PPO | Source: Ambulatory Visit | Attending: Physician Assistant | Admitting: Physician Assistant

## 2021-08-12 ENCOUNTER — Ambulatory Visit: Payer: BC Managed Care – PPO | Admitting: Physician Assistant

## 2021-08-12 ENCOUNTER — Other Ambulatory Visit: Payer: Self-pay

## 2021-08-12 ENCOUNTER — Encounter: Payer: Self-pay | Admitting: Physician Assistant

## 2021-08-12 VITALS — BP 106/70 | HR 92 | Temp 98.2°F | Ht 68.5 in | Wt 180.4 lb

## 2021-08-12 DIAGNOSIS — Z124 Encounter for screening for malignant neoplasm of cervix: Secondary | ICD-10-CM | POA: Insufficient documentation

## 2021-08-12 DIAGNOSIS — N898 Other specified noninflammatory disorders of vagina: Secondary | ICD-10-CM | POA: Diagnosis not present

## 2021-08-12 MED ORDER — HYDROXYZINE PAMOATE 25 MG PO CAPS: 25.0000 mg | ORAL_CAPSULE | Freq: Three times a day (TID) | ORAL | 0 refills | Status: DC | PRN

## 2021-08-12 MED ORDER — TRIAMCINOLONE ACETONIDE 0.025 % EX CREA: TOPICAL_CREAM | CUTANEOUS | 0 refills | Status: DC

## 2021-08-12 NOTE — Patient Instructions (Addendum)
It was great to see you!  We are sending out your swab and will be in touch with the results  Start a daily over the counter antihistamines such as Zyrtec (cetirizine), Claritin (loratadine), Allegra (fexofenadine), or Xyzal (levocetirizine) for two weeks (longer if you feel like this is benefitting you!)  For breakthrough itching, may take as needed hydroxyzine. This is like a strong benadryl, so may make you drowsy.  I am sending in a steroid cream for you to apply 1-2 times daily to the area to calm it down.  Not likely lamictal, but please discuss with your psychiatrist out of an abundance of caution.  Definitely follow-up with dermatology.  Take care,  [name] PA-C

## 2021-08-13 DIAGNOSIS — M25661 Stiffness of right knee, not elsewhere classified: Secondary | ICD-10-CM | POA: Diagnosis not present

## 2021-08-16 ENCOUNTER — Other Ambulatory Visit: Payer: Self-pay | Admitting: Physician Assistant

## 2021-08-16 LAB — CERVICOVAGINAL ANCILLARY ONLY
Bacterial Vaginitis (gardnerella): POSITIVE — AB
Candida Glabrata: NEGATIVE
Candida Vaginitis: NEGATIVE
Chlamydia: NEGATIVE
Comment: NEGATIVE
Comment: NEGATIVE
Comment: NEGATIVE
Comment: NEGATIVE
Comment: NEGATIVE
Comment: NORMAL
Neisseria Gonorrhea: NEGATIVE
Trichomonas: NEGATIVE

## 2021-08-16 LAB — CYTOLOGY - PAP
Adequacy: ABSENT
Comment: NEGATIVE
Diagnosis: NEGATIVE
High risk HPV: NEGATIVE

## 2021-08-16 MED ORDER — METRONIDAZOLE 500 MG PO TABS: 500.0000 mg | ORAL_TABLET | Freq: Two times a day (BID) | ORAL | 0 refills | Status: AC

## 2021-08-18 DIAGNOSIS — M25561 Pain in right knee: Secondary | ICD-10-CM | POA: Diagnosis not present

## 2021-08-18 DIAGNOSIS — Z9889 Other specified postprocedural states: Secondary | ICD-10-CM | POA: Diagnosis not present

## 2021-08-23 DIAGNOSIS — M25661 Stiffness of right knee, not elsewhere classified: Secondary | ICD-10-CM | POA: Diagnosis not present

## 2021-08-25 DIAGNOSIS — M25661 Stiffness of right knee, not elsewhere classified: Secondary | ICD-10-CM | POA: Diagnosis not present

## 2021-09-11 DIAGNOSIS — B9689 Other specified bacterial agents as the cause of diseases classified elsewhere: Secondary | ICD-10-CM | POA: Diagnosis not present

## 2021-09-11 DIAGNOSIS — N760 Acute vaginitis: Secondary | ICD-10-CM | POA: Diagnosis not present

## 2021-09-17 ENCOUNTER — Telehealth: Payer: BC Managed Care – PPO | Admitting: Physician Assistant

## 2021-09-21 ENCOUNTER — Ambulatory Visit: Payer: BC Managed Care – PPO | Admitting: Physician Assistant

## 2021-09-23 DIAGNOSIS — N898 Other specified noninflammatory disorders of vagina: Secondary | ICD-10-CM | POA: Diagnosis not present

## 2021-09-23 DIAGNOSIS — B3731 Acute candidiasis of vulva and vagina: Secondary | ICD-10-CM | POA: Diagnosis not present

## 2021-09-24 DIAGNOSIS — N898 Other specified noninflammatory disorders of vagina: Secondary | ICD-10-CM | POA: Diagnosis not present

## 2021-09-28 ENCOUNTER — Telehealth (INDEPENDENT_AMBULATORY_CARE_PROVIDER_SITE_OTHER): Payer: BC Managed Care – PPO | Admitting: Physician Assistant

## 2021-09-28 ENCOUNTER — Other Ambulatory Visit: Payer: Self-pay | Admitting: Physician Assistant

## 2021-09-28 ENCOUNTER — Encounter: Payer: Self-pay | Admitting: Physician Assistant

## 2021-09-28 DIAGNOSIS — N760 Acute vaginitis: Secondary | ICD-10-CM

## 2021-09-28 DIAGNOSIS — E282 Polycystic ovarian syndrome: Secondary | ICD-10-CM

## 2021-09-28 MED ORDER — METRONIDAZOLE 0.75 % VA GEL: VAGINAL | 3 refills | Status: DC

## 2021-09-28 MED ORDER — METRONIDAZOLE 500 MG PO TABS: 500.0000 mg | ORAL_TABLET | Freq: Two times a day (BID) | ORAL | 0 refills | Status: AC

## 2021-09-28 NOTE — Progress Notes (Signed)
? ?Virtual Visit via Video Note  ? ?I, , connected with  Veronica Ramirez  ([id number], [birth year]) on 09/28/21 at 10:20 AM EDT by a video-enabled telemedicine application and verified that I am speaking with the correct person using two identifiers. ? ?Location: ?Patient: Home ?Provider: [place] office ?  ?I discussed the limitations of evaluation and management by telemedicine and the availability of in person appointments. The patient expressed understanding and agreed to proceed.   ? ?History of Present Illness: ?Veronica Ramirez is a 36 y.o. who identifies as a female who was assigned female at birth, and is being seen today for recurrent vaginitis. ? ?Patient reports that since she last saw me she has had two episodes of vaginitis. She had confirmed BV on 09/11/21 at [facility] and took flagyl 500 mg BID x 7 days for this. She had change in symptoms and went back to UC on 09/23/21 and was given diflucan 150 mg x 5 (repeat q 2-3 days if symptomatic) and has taken two of these. ? ?She has started oral probiotic. ? ?She feels as though she is having recurrent BV symptoms again. She would like to see gynecology. Denies current concerns for STI or pregnancy. ? ?Problems:  ?Patient Active Problem List  ? Diagnosis Date Noted  ? Mood swings 09/30/2020  ? Interstitial cystitis 09/30/2020  ? Attention deficit hyperactivity disorder (ADHD), predominantly hyperactive type 05/30/2018  ? PCOS (polycystic ovarian syndrome) 05/30/2018  ? Obesity, Class I, BMI 30-34.9 08/18/2014  ?  ?Allergies:  ?Allergies  ?Allergen Reactions  ? Adhesive [Tape] Rash  ? ?Medications:  ?Current Outpatient Medications:  ?  metroNIDAZOLE (FLAGYL) 500 MG tablet, Take 1 tablet (500 mg total) by mouth 2 (two) times daily for 10 days., Disp: 20 tablet, Rfl: 0 ?  metroNIDAZOLE (METROGEL) 0.75 % vaginal gel, Apply 1 applicatorful vaginally two times a week x 4 months, Disp: 70 g, Rfl: 3 ?  ALPRAZolam (XANAX) 1 MG tablet, Take 1 tablet by mouth  daily as needed., Disp: , Rfl: 2 ?  amphetamine-dextroamphetamine (ADDERALL) 30 MG tablet, Take 1 tablet by mouth 4 (four) times daily., Disp: , Rfl:  ?  cyanocobalamin (,VITAMIN B-12,) 1000 MCG/ML injection, Inject 1 ml into muscle monthly, Disp: 10 mL, Rfl: 0 ?  hydrOXYzine (VISTARIL) 25 MG capsule, Take 1 capsule (25 mg total) by mouth every 8 (eight) hours as needed for itching., Disp: 30 capsule, Rfl: 0 ?  lamoTRIgine (LAMICTAL) 100 MG tablet, Take 100 mg by mouth daily., Disp: , Rfl:  ?  lamoTRIgine (LAMICTAL) 25 MG tablet, Take 25 mg by mouth daily. , Disp: , Rfl:  ?  Melatonin 5 MG CAPS, Take 2 capsules by mouth at bedtime., Disp: , Rfl:  ?  metFORMIN (GLUCOPHAGE) 500 MG tablet, Take 1 tablet (500 mg total) by mouth 2 (two) times daily with a meal., Disp: 180 tablet, Rfl: 3 ?  nystatin cream (MYCOSTATIN), Apply to affected 1-2 times daily, Disp: 30 g, Rfl: 0 ?  PARAGARD INTRAUTERINE COPPER IUD IUD, by Intrauterine route., Disp: , Rfl:  ?  spironolactone (ALDACTONE) 100 MG tablet, Take 1 tablet (100 mg total) by mouth daily., Disp: 90 tablet, Rfl: 3 ?  SYRINGE-NEEDLE, DISP, 3 ML (B-D 3CC LUER-LOK SYR 25GX1") 25G X 1" 3 ML MISC, USE TO INJECT VIT B12 INJECTIONS, Disp: 6 each, Rfl: 0 ?  traZODone (DESYREL) 100 MG tablet, Take 1 tablet by mouth at bedtime., Disp: , Rfl: 0 ?  triamcinolone (KENALOG) 0.025 % cream,  Apply to affected area 1-2 times daily, Disp: 80 g, Rfl: 0 ? ?Observations/Objective: ?Patient is well-developed, well-nourished in no acute distress.  ?Resting comfortably  at home.  ?Head is normocephalic, atraumatic.  ?No labored breathing.  ?Speech is clear and coherent with logical content.  ?Patient is alert and oriented at baseline.  ? ? ?Assessment and Plan: ?1. Recurrent vaginitis ?- Ambulatory referral to Gynecology ? ?Uncontrolled ?Trial oral Flagyl (500 mg BID for 7-10 days), then 2 x a week metrogel for 4-6 months.  ?Gynecology referral placed ?Follow-up with [place] as needed ?Continue oral  probiotic ? ?Follow Up Instructions: ?I discussed the assessment and treatment plan with the patient. The patient was provided an opportunity to ask questions and all were answered. The patient agreed with the plan and demonstrated an understanding of the instructions.  A copy of instructions were sent to the patient via MyChart unless otherwise noted below.  ? ?The patient was advised to call back or seek an in-person evaluation if the symptoms worsen or if the condition fails to improve as anticipated. ? ?[name], PA ?

## 2021-10-20 ENCOUNTER — Ambulatory Visit: Payer: BC Managed Care – PPO | Admitting: Physician Assistant

## 2021-10-27 DIAGNOSIS — F909 Attention-deficit hyperactivity disorder, unspecified type: Secondary | ICD-10-CM | POA: Diagnosis not present

## 2021-10-27 DIAGNOSIS — F331 Major depressive disorder, recurrent, moderate: Secondary | ICD-10-CM | POA: Diagnosis not present

## 2021-10-27 DIAGNOSIS — F341 Dysthymic disorder: Secondary | ICD-10-CM | POA: Diagnosis not present

## 2021-11-22 ENCOUNTER — Other Ambulatory Visit (HOSPITAL_COMMUNITY)
Admission: RE | Admit: 2021-11-22 | Discharge: 2021-11-22 | Disposition: A | Discharge status: Home / Self Care | Payer: BC Managed Care – PPO | Source: Ambulatory Visit | Attending: Physician Assistant | Admitting: Physician Assistant

## 2021-11-22 ENCOUNTER — Encounter: Payer: Self-pay | Admitting: Physician Assistant

## 2021-11-22 ENCOUNTER — Ambulatory Visit: Payer: BC Managed Care – PPO | Admitting: Physician Assistant

## 2021-11-22 VITALS — BP 110/70 | HR 74 | Temp 98.1°F | Ht 68.5 in | Wt 176.0 lb

## 2021-11-22 DIAGNOSIS — N760 Acute vaginitis: Secondary | ICD-10-CM | POA: Insufficient documentation

## 2021-11-22 DIAGNOSIS — N898 Other specified noninflammatory disorders of vagina: Secondary | ICD-10-CM | POA: Diagnosis not present

## 2021-11-22 DIAGNOSIS — K130 Diseases of lips: Secondary | ICD-10-CM | POA: Diagnosis not present

## 2021-11-22 DIAGNOSIS — N941 Unspecified dyspareunia: Secondary | ICD-10-CM | POA: Diagnosis not present

## 2021-11-22 NOTE — Progress Notes (Signed)
Veronica Ramirez is a 36 y.o. female here for a follow up of a pre-existing problem.  History of Present Illness:   Chief Complaint  Patient presents with   Vaginal Discharge    Pt c/o vaginal discharge s 4 days with odor.     HPI  Dyspareunia; Recurrent BV Patient complain of vaginal pain that has been onset since yesterday. She states she had intercourse last night with her partner and had pain since then. She has been having some vaginal discharge as well. She is concerned about this issue. Denies itching or burning sensation. Denies  fevers, chills, pelvic pain, unusual vaginal bleeding, concerns for pregnancy.   Rash Patient complain of rash that has been onset for the past few weeks. Located around the lips. States this was like a little blister around the lips. She states she similar issue during the start of this year. She has tried Abreva and moisturizer with some improvement. No other treatment tried. Denies fever or chills. Denies itch.   Past Medical History:  Diagnosis Date   History of chickenpox      Social History   Tobacco Use   Smoking status: Former    Types: Cigarettes    Quit date: 2010    Years since quitting: 13.4   Smokeless tobacco: Never  Vaping Use   Vaping Use: Never used  Substance Use Topics   Alcohol use: Yes    Comment: 3-4 glasses of wine   Drug use: Not Currently    Past Surgical History:  Procedure Laterality Date   KNEE ARTHROSCOPY W/ MENISCAL REPAIR Left 12/12/2019    Family History  Problem Relation Age of Onset   Multiple sclerosis Mother    COPD Father    Cancer Father        Lung   Heart attack Father    Cancer Paternal Aunt        Breast   Multiple sclerosis Paternal Aunt    Cancer Paternal Aunt        Breast    Allergies  Allergen Reactions   Adhesive [Tape] Rash    Current Medications:   Current Outpatient Medications:    ALPRAZolam (XANAX) 1 MG tablet, Take 1 tablet by mouth daily as needed., Disp: , Rfl:  2   amphetamine-dextroamphetamine (ADDERALL) 30 MG tablet, Take 1 tablet by mouth 4 (four) times daily., Disp: , Rfl:    cyanocobalamin (,VITAMIN B-12,) 1000 MCG/ML injection, Inject 1 ml into muscle monthly, Disp: 10 mL, Rfl: 0   hydrOXYzine (VISTARIL) 25 MG capsule, Take 1 capsule (25 mg total) by mouth every 8 (eight) hours as needed for itching., Disp: 30 capsule, Rfl: 0   lamoTRIgine (LAMICTAL) 100 MG tablet, Take 100 mg by mouth daily., Disp: , Rfl:    lamoTRIgine (LAMICTAL) 25 MG tablet, Take 25 mg by mouth daily. , Disp: , Rfl:    Melatonin 5 MG CAPS, Take 2 capsules by mouth at bedtime., Disp: , Rfl:    metFORMIN (GLUCOPHAGE) 500 MG tablet, TAKE 1 TABLET(500 MG) BY MOUTH TWICE DAILY WITH A MEAL, Disp: 180 tablet, Rfl: 3   nystatin cream (MYCOSTATIN), Apply to affected 1-2 times daily, Disp: 30 g, Rfl: 0   PARAGARD INTRAUTERINE COPPER IUD IUD, by Intrauterine route., Disp: , Rfl:    spironolactone (ALDACTONE) 100 MG tablet, TAKE 1 TABLET(100 MG) BY MOUTH DAILY, Disp: 90 tablet, Rfl: 3   SYRINGE-NEEDLE, DISP, 3 ML (B-D 3CC LUER-LOK SYR 25GX1") 25G X 1" 3 ML MISC, USE  TO INJECT VIT B12 INJECTIONS, Disp: 6 each, Rfl: 0   traZODone (DESYREL) 100 MG tablet, Take 1 tablet by mouth at bedtime., Disp: , Rfl: 0   triamcinolone (KENALOG) 0.025 % cream, Apply to affected area 1-2 times daily, Disp: 80 g, Rfl: 0   metroNIDAZOLE (METROGEL) 0.75 % vaginal gel, Apply 1 applicatorful vaginally two times a week x 4 months (Patient not taking: Reported on 11/22/2021), Disp: 70 g, Rfl: 3   Review of Systems:   ROS Negative unless otherwise specified per HPI.   Vitals:   Vitals:   11/22/21 0820  BP: 110/70  Pulse: 74  Temp: 98.1 F (36.7 C)  TempSrc: Temporal  SpO2: 98%  Weight: 176 lb (79.8 kg)  Height: 5' 8.5" (1.74 m)     Body mass index is 26.37 kg/m.  Physical Exam:   Physical Exam Constitutional:      Appearance: Normal appearance. She is well-developed.  HENT:     Head:  Normocephalic and atraumatic.  Eyes:     General: Lids are normal.     Extraocular Movements: Extraocular movements intact.     Conjunctiva/sclera: Conjunctivae normal.  Pulmonary:     Effort: Pulmonary effort is normal.  Genitourinary:    Labia:        Right: No rash or tenderness.        Left: No rash or tenderness.      Vagina: Normal. No tenderness.     Cervix: No cervical motion tenderness.  Musculoskeletal:        General: Normal range of motion.     Cervical back: Normal range of motion and neck supple.  Skin:    General: Skin is warm and dry.  Neurological:     Mental Status: She is alert and oriented to person, place, and time.  Psychiatric:        Attention and Perception: Attention and perception normal.        Mood and Affect: Mood normal.        Behavior: Behavior normal.        Thought Content: Thought content normal.        Judgment: Judgment normal.    Assessment and Plan:   Rash on lips No evidence of rash today If returns, will trial a around of valtrex to see if this helps  Dyspareunia in female No red flags on exam; no CMT Provided phone number for [facility] to follow-up regarding IUD placement   Vaginal discharge Update swab today to look for BV/yeast Will resume metrogel, but frequency to be determined based on results of swab Follow-up based on results  I,Savera Zaman,acting as a scribe for [employer], PA.,have documented all relevant documentation on the behalf of [name], PA,as directed by  [name], PA while in the presence of [name], [place].   I, [name], [place], have reviewed all documentation for this visit. The documentation on 11/22/21 for the exam, diagnosis, procedures, and orders are all accurate and complete.   [name], PA-C

## 2021-11-22 NOTE — Patient Instructions (Signed)
It was great to see you!  [phone] -- [facility] Gynecology  I will be in touch with vaginal swab results and the recommendations  If your lip issues return, please message me and I will send in Valtrex  Take care,  [name] PA-C

## 2021-11-23 LAB — CERVICOVAGINAL ANCILLARY ONLY
Bacterial Vaginitis (gardnerella): POSITIVE — AB
Candida Glabrata: NEGATIVE
Candida Vaginitis: NEGATIVE
Comment: NEGATIVE
Comment: NEGATIVE
Comment: NEGATIVE

## 2021-11-25 ENCOUNTER — Encounter: Payer: Self-pay | Admitting: Physician Assistant

## 2021-11-26 ENCOUNTER — Other Ambulatory Visit: Payer: Self-pay | Admitting: Physician Assistant

## 2021-11-26 MED ORDER — FLUCONAZOLE 150 MG PO TABS: ORAL_TABLET | ORAL | 0 refills | Status: DC

## 2022-01-26 DIAGNOSIS — F900 Attention-deficit hyperactivity disorder, predominantly inattentive type: Secondary | ICD-10-CM | POA: Diagnosis not present

## 2022-01-26 DIAGNOSIS — F411 Generalized anxiety disorder: Secondary | ICD-10-CM | POA: Diagnosis not present

## 2022-01-26 DIAGNOSIS — F39 Unspecified mood [affective] disorder: Secondary | ICD-10-CM | POA: Diagnosis not present

## 2022-02-15 ENCOUNTER — Ambulatory Visit: Payer: BC Managed Care – PPO | Admitting: Nurse Practitioner

## 2022-02-15 ENCOUNTER — Encounter: Payer: Self-pay | Admitting: Nurse Practitioner

## 2022-02-15 VITALS — BP 118/74 | HR 72 | Resp 12 | Ht 68.25 in | Wt 170.0 lb

## 2022-02-15 DIAGNOSIS — Z975 Presence of (intrauterine) contraceptive device: Secondary | ICD-10-CM

## 2022-02-15 DIAGNOSIS — R630 Anorexia: Secondary | ICD-10-CM

## 2022-02-15 DIAGNOSIS — N9412 Deep dyspareunia: Secondary | ICD-10-CM | POA: Diagnosis not present

## 2022-02-15 DIAGNOSIS — N920 Excessive and frequent menstruation with regular cycle: Secondary | ICD-10-CM

## 2022-02-15 DIAGNOSIS — N949 Unspecified condition associated with female genital organs and menstrual cycle: Secondary | ICD-10-CM

## 2022-02-15 DIAGNOSIS — L729 Follicular cyst of the skin and subcutaneous tissue, unspecified: Secondary | ICD-10-CM

## 2022-02-15 DIAGNOSIS — R102 Pelvic and perineal pain: Secondary | ICD-10-CM

## 2022-02-15 MED ORDER — DOXYCYCLINE MONOHYDRATE 100 MG PO CAPS: 100.0000 mg | ORAL_CAPSULE | Freq: Two times a day (BID) | ORAL | 0 refills | Status: AC

## 2022-02-15 NOTE — Progress Notes (Signed)
Acute Office Visit  Subjective:    Patient ID: Veronica Ramirez, female    DOB: April 25, 1986, 36 y.o.   MRN: [id number]   HPI 36 y.o. G1P0010 presents as new patient to discuss pain. She has been experiencing left-sided pelvic pain that is now in lower back x 1 month. Pain is constant and crampy. She has also been having pain with intercourse. Pain is deep and with all positions. Pain does not linger. Has been with same partner x 1 year. No bleeding with intercourse. Paragard IUD x 6-7 years, this is her second one. Has monthly cycles, reports that they have been heavier for about 6 months. H/O PCOS. BV infections 03/2021, 07/2021, 09/2021, 11/2021. Also recurrent yeast infections. Was using suppressive Metrogel twice weekly for about 6 weeks, stopped about a week ago. Denies BV symptoms today. Taking probiotic. Normal pap history, most recent 07/2021. Decrease appetite for a few weeks. Denies vomiting, constipation, diarrhea. Has some nausea with eating. Reports 9 pound weight loss. Would like a spot on her breast looked at. It is superficial and has been there for about a year. No pain, redness, drainage.    Review of Systems  Constitutional: Negative.   Gastrointestinal:  Positive for abdominal pain. Negative for constipation, diarrhea, nausea and vomiting.       Decreased appetite  Genitourinary:  Positive for dyspareunia and menstrual problem (Heavy menses). Negative for genital sores, vaginal discharge and vaginal pain.  Musculoskeletal:  Positive for back pain.  Skin:        Superficial bump on right inner breast/chest wall       Objective:    Physical Exam Constitutional:      Appearance: Normal appearance.  Chest:  Breasts:    Right: Mass present. No swelling, bleeding, inverted nipple, nipple discharge, skin change or tenderness.     Left: Normal.    Abdominal:     Tenderness: There is no abdominal tenderness. There is no guarding or rebound.  Genitourinary:    General:  Normal vulva.     Vagina: Normal.     Cervix: Cervical motion tenderness present. No discharge, friability, lesion or erythema.     Uterus: Normal.      Comments: Very painful with palpation of cervix. IUD string visible    BP 118/74   Pulse 72   Resp 12   Ht 5' 8.25" (1.734 m)   Wt 170 lb (77.1 kg)   LMP 01/27/2022   BMI 25.66 kg/m  Wt Readings from Last 3 Encounters:  02/15/22 170 lb (77.1 kg)  11/22/21 176 lb (79.8 kg)  08/12/21 180 lb 6.1 oz (81.8 kg)        Patient informed chaperone available to be present for breast and/or pelvic exam. Patient has requested no chaperone to be present. Patient has been advised what will be completed during breast and pelvic exam.   Assessment & Plan:   Problem List Items Addressed This Visit   None Visit Diagnoses     Pelvic pain    -  Primary   Relevant Medications   doxycycline (MONODOX) 100 MG capsule   Other Relevant Orders   US PELVIS TRANSVAGINAL NON-OB (TV ONLY)   Deep dyspareunia       Relevant Orders   US PELVIS TRANSVAGINAL NON-OB (TV ONLY)   IUD (intrauterine device) in place       Relevant Orders   US PELVIS TRANSVAGINAL NON-OB (TV ONLY)   Menorrhagia with regular cycle  Relevant Orders   US PELVIS TRANSVAGINAL NON-OB (TV ONLY)   Cervical motion tenderness       Relevant Medications   doxycycline (MONODOX) 100 MG capsule   Decreased appetite       Skin cyst          Plan: Significant cervical motion tenderness. Will treat for possible PID with Doxy BID x 10 days. If no improvement with pain, ultrasound recommended. IUD string visible during exam. Restart Metrogel twice weekly x 6-8 weeks, then begin boric acid vaginal suppositories twice weekly for BV prevention. Continue probiotic. Discussed possibility of semen altering vaginal pH, condom use recommended. Benign superficial cyst on right inner breast/chest. Will monitor, has not changed in size. If appetite does not improve after antibiotic course she  will discuss with PCP. She is agreeable to plan.     Tamela Gammon DNP, 11:00 AM 02/15/2022

## 2022-03-02 ENCOUNTER — Other Ambulatory Visit: Payer: Self-pay | Admitting: Nurse Practitioner

## 2022-03-02 DIAGNOSIS — B3731 Acute candidiasis of vulva and vagina: Secondary | ICD-10-CM

## 2022-03-02 MED ORDER — FLUCONAZOLE 150 MG PO TABS: 150.0000 mg | ORAL_TABLET | ORAL | 0 refills | Status: DC

## 2022-03-17 ENCOUNTER — Other Ambulatory Visit: Payer: Self-pay | Admitting: Nurse Practitioner

## 2022-03-17 ENCOUNTER — Other Ambulatory Visit: Payer: BC Managed Care – PPO

## 2022-03-17 DIAGNOSIS — Z0289 Encounter for other administrative examinations: Secondary | ICD-10-CM

## 2022-04-22 DIAGNOSIS — F331 Major depressive disorder, recurrent, moderate: Secondary | ICD-10-CM | POA: Diagnosis not present

## 2022-04-22 DIAGNOSIS — F428 Other obsessive-compulsive disorder: Secondary | ICD-10-CM | POA: Diagnosis not present

## 2022-04-22 DIAGNOSIS — F900 Attention-deficit hyperactivity disorder, predominantly inattentive type: Secondary | ICD-10-CM | POA: Diagnosis not present

## 2022-04-22 DIAGNOSIS — F411 Generalized anxiety disorder: Secondary | ICD-10-CM | POA: Diagnosis not present

## 2022-04-28 DIAGNOSIS — R52 Pain, unspecified: Secondary | ICD-10-CM | POA: Diagnosis not present

## 2022-04-28 DIAGNOSIS — B349 Viral infection, unspecified: Secondary | ICD-10-CM | POA: Diagnosis not present

## 2022-04-28 DIAGNOSIS — R051 Acute cough: Secondary | ICD-10-CM | POA: Diagnosis not present

## 2022-04-28 DIAGNOSIS — R0981 Nasal congestion: Secondary | ICD-10-CM | POA: Diagnosis not present

## 2022-04-28 DIAGNOSIS — J029 Acute pharyngitis, unspecified: Secondary | ICD-10-CM | POA: Diagnosis not present

## 2022-06-30 ENCOUNTER — Other Ambulatory Visit: Payer: Self-pay | Admitting: Physician Assistant

## 2022-06-30 DIAGNOSIS — E282 Polycystic ovarian syndrome: Secondary | ICD-10-CM

## 2022-08-17 NOTE — Progress Notes (Signed)
Veronica Ramirez is a 37 y.o. female here for a medication refill.  History of Present Illness:   Chief Complaint  Patient presents with   Vaginal Discharge    Pt c/o clear vaginal discharge slight odor, external itching.    HPI  Vitamin B-12 Deficiency Requesting B-12 refill. Has one B-12 injection left. This has greatly helped her symptoms. Does not take oral B-12. She is comfortable with doing the injections herself.  PCOS Treated with Metformin 500 mg twice daily with meal, Aldactone 100 mg daily. Requesting Aldactone refill. No negative complications reported with this medication.  Vaginitis Has had symptoms of clear vaginal discharge, slight odor, external itching after having sex before her last menstruation. Not using creams or Diflucan. Paragard IUD in place. Taking a probiotic and eating yogurt for vaginal health.  Weight Loss Has been losing weight recently. Lost 11 pounds between 02/15/22 and today. Has a lack of interest in foods that she normally likes. Does not feel like its related to her Adderall or Metformin -- has been stable on these dosages.  Past Medical History:  Diagnosis Date   ADHD    Anxiety    Depression    History of chickenpox    Insomnia    followed by [place] [facility]   Interstitial cystitis    followed by [facility]     Social History   Tobacco Use   Smoking status: Former    Types: Cigarettes    Quit date: 2010    Years since quitting: 14.1    Passive exposure: Past   Smokeless tobacco: Never  Vaping Use   Vaping Use: Never used  Substance Use Topics   Alcohol use: Not Currently   Drug use: Not Currently    Past Surgical History:  Procedure Laterality Date   KNEE ARTHROSCOPY W/ MENISCAL REPAIR Left 12/12/2019    Family History  Problem Relation Age of Onset   Multiple sclerosis Mother    Bipolar disorder Mother    COPD Father    Cancer Father        Lung   Heart attack Father    Breast cancer Paternal  Aunt    Cancer Paternal Aunt        Breast   Multiple sclerosis Paternal Aunt    Breast cancer Paternal Aunt    Cancer Paternal Aunt        Breast    Allergies  Allergen Reactions   Adhesive [Tape] Rash    Current Medications:   Current Outpatient Medications:    ALPRAZolam (XANAX) 1 MG tablet, Take 1 tablet by mouth at bedtime., Disp: , Rfl: 2   amphetamine-dextroamphetamine (ADDERALL) 30 MG tablet, Take 1 tablet by mouth 4 (four) times daily., Disp: , Rfl:    cyanocobalamin (,VITAMIN B-12,) 1000 MCG/ML injection, Inject 1 ml into muscle monthly, Disp: 10 mL, Rfl: 0   lamoTRIgine (LAMICTAL) 100 MG tablet, Take 100 mg by mouth daily., Disp: , Rfl:    lamoTRIgine (LAMICTAL) 25 MG tablet, Take 25 mg by mouth daily. , Disp: , Rfl:    Melatonin 5 MG CAPS, Take 2 capsules by mouth at bedtime., Disp: , Rfl:    metFORMIN (GLUCOPHAGE) 500 MG tablet, TAKE 1 TABLET(500 MG) BY MOUTH TWICE DAILY WITH A MEAL, Disp: 180 tablet, Rfl: 0   PARAGARD INTRAUTERINE COPPER IUD IUD, by Intrauterine route., Disp: , Rfl:    Probiotic Product (PROBIOTIC PO), Take by mouth., Disp: , Rfl:    spironolactone (ALDACTONE) 100 MG tablet,  TAKE 1 TABLET(100 MG) BY MOUTH DAILY, Disp: 90 tablet, Rfl: 3   SYRINGE-NEEDLE, DISP, 3 ML (B-D 3CC LUER-LOK SYR 25GX1") 25G X 1" 3 ML MISC, USE TO INJECT VIT B12 INJECTIONS, Disp: 6 each, Rfl: 0   traZODone (DESYREL) 100 MG tablet, Take 1 tablet by mouth at bedtime., Disp: , Rfl: 0   Review of Systems:   Review of Systems  Constitutional:  Negative for fever and malaise/fatigue.  HENT:  Negative for congestion.   Eyes:  Negative for blurred vision.  Respiratory:  Negative for cough and shortness of breath.   Cardiovascular:  Negative for chest pain, palpitations and leg swelling.  Gastrointestinal:  Negative for vomiting.  Genitourinary:        (+) External vaginal itching (+) Clear vaginal discharge (+) Slight vaginal odor  Musculoskeletal:  Negative for back pain.   Skin:  Negative for rash.  Neurological:  Negative for loss of consciousness and headaches.    Vitals:   Vitals:   08/24/22 0815  BP: 104/70  Pulse: 87  Temp: 97.8 F (36.6 C)  TempSrc: Temporal  SpO2: 97%  Weight: 159 lb 4 oz (72.2 kg)  Height: 5' 8.25" (1.734 m)     Body mass index is 24.04 kg/m.  Physical Exam:   Physical Exam Vitals and nursing note reviewed.  Constitutional:      General: She is not in acute distress.    Appearance: She is well-developed. She is not ill-appearing or toxic-appearing.  Cardiovascular:     Rate and Rhythm: Normal rate and regular rhythm.     Pulses: Normal pulses.     Heart sounds: Normal heart sounds, S1 normal and S2 normal.  Pulmonary:     Effort: Pulmonary effort is normal.     Breath sounds: Normal breath sounds.  Skin:    General: Skin is warm and dry.  Neurological:     Mental Status: She is alert.     GCS: GCS eye subscore is 4. GCS verbal subscore is 5. GCS motor subscore is 6.  Psychiatric:        Speech: Speech normal.        Behavior: Behavior normal. Behavior is cooperative.    Patient obtained self-swab  Assessment and Plan:   PCOS (polycystic ovarian syndrome) Overall well controlled per patient Continue metformin 500 mg BID -- she does not want to change this dosage, this is reasonable Continue spironolactone 100 mg daily -- will check K+ to make sure we can safely continues this Follow-up in 6 months, sooner if concerns  B12 deficiency Update B12 Low threshold to continue B12 injections as they have really helped her  Recurrent vaginitis Self swab obtained Treat based on results Consider follow-up with gyn  Loss of appetite UTD on all cancer screenings This weight loss was overall intentional but she is afraid to continue to lose She is meeting with a nutritionist soon to discuss this Offered reduction in metformin to see if this changes appetite, she declined Recommend that she continue to  monitor weight at home and follow-up if consistently losing and we will do appropriate work-up  I,Alexander Ruley,acting as a scribe for [name], PA.,have documented all relevant documentation on the behalf of [name], PA,as directed by  [name], PA while in the presence of [name], [place].  I, [name], [place], have reviewed all documentation for this visit. The documentation on 08/24/22 for the exam, diagnosis, procedures, and orders are all accurate and complete.  [name], PA-C

## 2022-08-24 ENCOUNTER — Other Ambulatory Visit (HOSPITAL_COMMUNITY)
Admission: RE | Admit: 2022-08-24 | Discharge: 2022-08-24 | Disposition: A | Discharge status: Home / Self Care | Payer: No Typology Code available for payment source | Source: Ambulatory Visit | Attending: Physician Assistant | Admitting: Physician Assistant

## 2022-08-24 ENCOUNTER — Ambulatory Visit: Payer: No Typology Code available for payment source | Admitting: Physician Assistant

## 2022-08-24 ENCOUNTER — Encounter: Payer: Self-pay | Admitting: Physician Assistant

## 2022-08-24 VITALS — BP 104/70 | HR 87 | Temp 97.8°F | Ht 68.25 in | Wt 159.2 lb

## 2022-08-24 DIAGNOSIS — R630 Anorexia: Secondary | ICD-10-CM

## 2022-08-24 DIAGNOSIS — E282 Polycystic ovarian syndrome: Secondary | ICD-10-CM

## 2022-08-24 DIAGNOSIS — N760 Acute vaginitis: Secondary | ICD-10-CM

## 2022-08-24 DIAGNOSIS — E538 Deficiency of other specified B group vitamins: Secondary | ICD-10-CM

## 2022-08-24 LAB — COMPREHENSIVE METABOLIC PANEL
ALT: 16 U/L (ref 0–35)
AST: 17 U/L (ref 0–37)
Albumin: 4.4 g/dL (ref 3.5–5.2)
Alkaline Phosphatase: 63 U/L (ref 39–117)
BUN: 15 mg/dL (ref 6–23)
CO2: 29 mEq/L (ref 19–32)
Calcium: 10.3 mg/dL (ref 8.4–10.5)
Chloride: 100 mEq/L (ref 96–112)
Creatinine, Ser: 0.96 mg/dL (ref 0.40–1.20)
GFR: 74.87 mL/min (ref >60.00)
Glucose, Bld: 62 mg/dL — ABNORMAL LOW (ref 70–99)
Potassium: 4.2 mEq/L (ref 3.5–5.1)
Sodium: 139 mEq/L (ref 135–145)
Total Bilirubin: 0.5 mg/dL (ref 0.2–1.2)
Total Protein: 6.8 g/dL (ref 6.0–8.3)

## 2022-08-24 LAB — VITAMIN B12: Vitamin B-12: 819 pg/mL (ref 211–911)

## 2022-08-24 MED ORDER — METFORMIN HCL 500 MG PO TABS: ORAL_TABLET | ORAL | 1 refills | Status: DC

## 2022-08-24 MED ORDER — SPIRONOLACTONE 100 MG PO TABS: ORAL_TABLET | ORAL | 3 refills | Status: DC

## 2022-08-24 NOTE — Patient Instructions (Addendum)
It was great to see you!  I will be in touch with your results  Keep an eye on your weight and let me know if you continue to lose  Follow-up in 6 months for a physical  Take care,  [name] PA-C

## 2022-08-25 ENCOUNTER — Other Ambulatory Visit: Payer: Self-pay | Admitting: Physician Assistant

## 2022-08-25 ENCOUNTER — Ambulatory Visit: Payer: No Typology Code available for payment source | Admitting: Physician Assistant

## 2022-08-25 DIAGNOSIS — E162 Hypoglycemia, unspecified: Secondary | ICD-10-CM

## 2022-08-25 LAB — CERVICOVAGINAL ANCILLARY ONLY
Bacterial Vaginitis (gardnerella): POSITIVE — AB
Candida Glabrata: NEGATIVE
Candida Vaginitis: NEGATIVE
Comment: NEGATIVE
Comment: NEGATIVE
Comment: NEGATIVE

## 2022-08-26 ENCOUNTER — Other Ambulatory Visit: Payer: Self-pay | Admitting: Physician Assistant

## 2022-08-26 MED ORDER — METRONIDAZOLE 500 MG PO TABS: 500.0000 mg | ORAL_TABLET | Freq: Two times a day (BID) | ORAL | 0 refills | Status: AC

## 2022-09-01 ENCOUNTER — Other Ambulatory Visit: Payer: No Typology Code available for payment source

## 2022-09-01 DIAGNOSIS — E162 Hypoglycemia, unspecified: Secondary | ICD-10-CM

## 2022-09-07 ENCOUNTER — Telehealth: Payer: Self-pay | Admitting: Physician Assistant

## 2022-09-07 NOTE — Telephone Encounter (Signed)
Patient requests [place] to see how long insulin lab will take to come back. States it was collected almost a week ago. Please Advise.

## 2022-09-07 NOTE — Telephone Encounter (Signed)
Called to inform patient lab can take 10-14 days to get back.

## 2022-09-08 LAB — C-PEPTIDE: C-Peptide: 1.06 ng/mL (ref 0.80–3.85)

## 2022-09-08 LAB — INSULIN, FREE (BIOACTIVE): Insulin, Free: 2.2 u[IU]/mL (ref 1.5–14.9)

## 2022-09-29 ENCOUNTER — Encounter: Payer: Self-pay | Admitting: Physician Assistant

## 2022-09-29 ENCOUNTER — Telehealth: Payer: No Typology Code available for payment source | Admitting: Physician Assistant

## 2022-09-29 DIAGNOSIS — N898 Other specified noninflammatory disorders of vagina: Secondary | ICD-10-CM | POA: Diagnosis not present

## 2022-09-29 MED ORDER — FLUCONAZOLE 150 MG PO TABS: ORAL_TABLET | ORAL | 0 refills | Status: DC

## 2022-09-29 NOTE — Progress Notes (Signed)
Virtual Visit via Video Note   I, Veronica Ramirez, connected with  Veronica Ramirez  ([id number], 01/11/1986) on 09/29/22 at 11:20 AM EDT by a video-enabled telemedicine application and verified that I am speaking with the correct person using two identifiers.  Location: Patient: Home Provider: [facility] [place] [facility] office   I discussed the limitations of evaluation and management by telemedicine and the availability of in person appointments. The patient expressed understanding and agreed to proceed.    History of Present Illness: Veronica Ramirez is a 37 y.o. who identifies as a female who was assigned female at birth, and is being seen today for vaginal itching.  Reports extreme itching over the past 5-6 days.  She trialed metrogel without improvement. No discharge or odor, concern for STD, pelvic pain or abnormal bleeding, concern for pregnancy Feels concerned she may have yeast infection   Problems:  Patient Active Problem List   Diagnosis Date Noted   Mood swings 09/30/2020   Interstitial cystitis 09/30/2020   Attention deficit hyperactivity disorder (ADHD), predominantly hyperactive type 05/30/2018   PCOS (polycystic ovarian syndrome) 05/30/2018   Obesity, Class I, BMI 30-34.9 08/18/2014    Allergies:  Allergies  Allergen Reactions   Adhesive [Tape] Rash   Medications:  Current Outpatient Medications:    ALPRAZolam (XANAX) 1 MG tablet, Take 1 tablet by mouth at bedtime., Disp: , Rfl: 2   amphetamine-dextroamphetamine (ADDERALL) 30 MG tablet, Take 1 tablet by mouth 4 (four) times daily., Disp: , Rfl:    cyanocobalamin (,VITAMIN B-12,) 1000 MCG/ML injection, Inject 1 ml into muscle monthly, Disp: 10 mL, Rfl: 0   fluconazole (DIFLUCAN) 150 MG tablet, Take 1 tablet by mouth. If symptoms persist, may take an additional tablet in 3-5 days. May repeat for a total of 3 tablets., Disp: 3 tablet, Rfl: 0   lamoTRIgine (LAMICTAL) 100 MG tablet, Take 100 mg by mouth daily., Disp:  , Rfl:    lamoTRIgine (LAMICTAL) 25 MG tablet, Take 25 mg by mouth daily. , Disp: , Rfl:    Melatonin 5 MG CAPS, Take 2 capsules by mouth at bedtime., Disp: , Rfl:    metFORMIN (GLUCOPHAGE) 500 MG tablet, TAKE 1 TABLET(500 MG) BY MOUTH TWICE DAILY WITH A MEAL, Disp: 180 tablet, Rfl: 1   PARAGARD INTRAUTERINE COPPER IUD IUD, by Intrauterine route., Disp: , Rfl:    Probiotic Product (PROBIOTIC PO), Take by mouth., Disp: , Rfl:    spironolactone (ALDACTONE) 100 MG tablet, TAKE 1 TABLET(100 MG) BY MOUTH DAILY, Disp: 90 tablet, Rfl: 3   SYRINGE-NEEDLE, DISP, 3 ML (B-D 3CC LUER-LOK SYR 25GX1") 25G X 1" 3 ML MISC, USE TO INJECT VIT B12 INJECTIONS, Disp: 6 each, Rfl: 0   traZODone (DESYREL) 100 MG tablet, Take 1 tablet by mouth at bedtime., Disp: , Rfl: 0  Observations/Objective: Patient is well-developed, well-nourished in no acute distress.  Resting comfortably at home.  Head is normocephalic, atraumatic.  No labored breathing.  Speech is clear and coherent with logical content.  Patient is alert and oriented at baseline.   Assessment and Plan: 1. Vaginal itching No red flags Recommend diflucan to treat empirically for yeast If lack of improvement or any concerns, will have patient come in for swab or any additional testing warranted  Follow Up Instructions: I discussed the assessment and treatment plan with the patient. The patient was provided an opportunity to ask questions and all were answered. The patient agreed with the plan and demonstrated an understanding of the instructions.  A copy of instructions were sent to the patient via MyChart unless otherwise noted below.   The patient was advised to call back or seek an in-person evaluation if the symptoms worsen or if the condition fails to improve as anticipated.  Veronica Ramirez, [place]

## 2022-10-04 ENCOUNTER — Telehealth: Payer: No Typology Code available for payment source | Admitting: Physician Assistant

## 2023-03-20 NOTE — Progress Notes (Signed)
Duplicate note - error.

## 2023-03-21 NOTE — Progress Notes (Signed)
Veronica Ramirez is a 37 y.o. female here for a new problem. History of Present Illness:   Chief Complaint  Patient presents with   Vaginal Discharge    Pt c/o cottage cheese discharge with itching last week, did two rounds of Monistat, then started with odor and now period has started.   HPI  Vaginitis: Reports textured discharge and severe vaginal itching began last week Self-treated with 2 rounds of Monistat-1 one week ago (took 1st dose, waited 3 days before taking 2nd dose).   Notes little to no improvement using the Monistat use, except itching has almost completley resolved.    Endorses malodor onset soon after treatment, and worsening over time.  States she has an insert for BV, but she can not currently use due to her period starting.  Denies concern for STDs.   Past Medical History:  Diagnosis Date   ADHD    Anxiety    Depression    History of chickenpox    Insomnia    followed by [facility]   Interstitial cystitis    followed by [facility]     Social History   Tobacco Use   Smoking status: Former    Current packs/day: 0.00    Types: Cigarettes    Quit date: 2010    Years since quitting: 14.7    Passive exposure: Past   Smokeless tobacco: Never  Vaping Use   Vaping status: Never Used  Substance Use Topics   Alcohol use: Not Currently   Drug use: Not Currently    Past Surgical History:  Procedure Laterality Date   KNEE ARTHROSCOPY W/ MENISCAL REPAIR Left 12/12/2019    Family History  Problem Relation Age of Onset   Multiple sclerosis Mother    Bipolar disorder Mother    COPD Father    Cancer Father        Lung   Heart attack Father    Breast cancer Paternal Aunt    Cancer Paternal Aunt        Breast   Multiple sclerosis Paternal Aunt    Breast cancer Paternal Aunt    Cancer Paternal Aunt        Breast    Allergies  Allergen Reactions   Adhesive [Tape] Rash    Current Medications:   Current Outpatient Medications:     ALPRAZolam (XANAX) 1 MG tablet, Take 1 tablet by mouth at bedtime., Disp: , Rfl: 2   amphetamine-dextroamphetamine (ADDERALL) 30 MG tablet, Take 1 tablet by mouth 4 (four) times daily., Disp: , Rfl:    fluconazole (DIFLUCAN) 150 MG tablet, Take 1 tablet by mouth. If symptoms persist, may take an additional tablet in 3-5 days. May repeat for a total of 3 tablets., Disp: 3 tablet, Rfl: 0   lamoTRIgine (LAMICTAL) 100 MG tablet, Take 100 mg by mouth daily., Disp: , Rfl:    lamoTRIgine (LAMICTAL) 25 MG tablet, Take 25 mg by mouth daily. , Disp: , Rfl:    Melatonin 5 MG CAPS, Take 2 capsules by mouth at bedtime., Disp: , Rfl:    metFORMIN (GLUCOPHAGE) 500 MG tablet, TAKE 1 TABLET(500 MG) BY MOUTH TWICE DAILY WITH A MEAL, Disp: 180 tablet, Rfl: 1   metroNIDAZOLE (FLAGYL) 500 MG tablet, Take 1 tablet (500 mg total) by mouth 2 (two) times daily for 7 days., Disp: 14 tablet, Rfl: 0   [START ON 03/23/2023] metroNIDAZOLE (METROGEL) 0.75 % vaginal gel, Place 1 Applicatorful vaginally 2 (two) times a week. Use for up to  6 months for suppressive therapy, Disp: 70 g, Rfl: 1   PARAGARD INTRAUTERINE COPPER IUD IUD, by Intrauterine route., Disp: , Rfl:    Probiotic Product (PROBIOTIC PO), Take by mouth., Disp: , Rfl:    spironolactone (ALDACTONE) 100 MG tablet, TAKE 1 TABLET(100 MG) BY MOUTH DAILY, Disp: 90 tablet, Rfl: 3   SYRINGE-NEEDLE, DISP, 3 ML (B-D 3CC LUER-LOK SYR 25GX1") 25G X 1" 3 ML MISC, USE TO INJECT VIT B12 INJECTIONS, Disp: 6 each, Rfl: 0   traZODone (DESYREL) 100 MG tablet, Take 1 tablet by mouth at bedtime., Disp: , Rfl: 0   Review of Systems:   ROS See pertinent positives and negatives as per the HPI.  Vitals:   Vitals:   03/22/23 0850  BP: 118/80  Pulse: 65  Temp: 98.2 F (36.8 C)  TempSrc: Temporal  SpO2: 98%  Weight: 164 lb 4 oz (74.5 kg)  Height: 5' 8.25" (1.734 m)     Body mass index is 24.79 kg/m.  Physical Exam:   Physical Exam Constitutional:      Appearance: Normal  appearance. She is well-developed.  HENT:     Head: Normocephalic and atraumatic.  Eyes:     General: Lids are normal.     Extraocular Movements: Extraocular movements intact.     Conjunctiva/sclera: Conjunctivae normal.  Pulmonary:     Effort: Pulmonary effort is normal.  Musculoskeletal:        General: Normal range of motion.     Cervical back: Normal range of motion and neck supple.  Skin:    General: Skin is warm and dry.  Neurological:     Mental Status: She is alert and oriented to person, place, and time.  Psychiatric:        Attention and Perception: Attention and perception normal.        Mood and Affect: Mood normal.        Behavior: Behavior normal.        Thought Content: Thought content normal.        Judgment: Judgment normal.     Assessment and Plan:   Vaginal itching Unable to perform vaginal exam due to heavy menstrual flow Will empirically treat for both bacterial vaginosis and yeast as follows: Take one oral diflucan tablet now -Start oral flagyl -After completing oral flagyl, take another oral diflucan  May then start twice a week vaginal metronidazole for bacterial vaginosis suppression  If any lingering symptom(s) or new concerns - let me know and we can have you come in for a self-swab  Consider gynecology follow-up if any ongoing issues   I,Emily Lagle,acting as a scribe for [employer], PA.,have documented all relevant documentation on the behalf of [name], PA,as directed by  [name], PA while in the presence of [name], [place].  I, [name], [place], have reviewed all documentation for this visit. The documentation on 03/22/23 for the exam, diagnosis, procedures, and orders are all accurate and complete.  [name], PA-C

## 2023-03-22 ENCOUNTER — Encounter: Payer: Self-pay | Admitting: Physician Assistant

## 2023-03-22 ENCOUNTER — Ambulatory Visit: Payer: No Typology Code available for payment source | Admitting: Physician Assistant

## 2023-03-22 VITALS — BP 118/80 | HR 65 | Temp 98.2°F | Ht 68.25 in | Wt 164.2 lb

## 2023-03-22 DIAGNOSIS — N898 Other specified noninflammatory disorders of vagina: Secondary | ICD-10-CM | POA: Diagnosis not present

## 2023-03-22 MED ORDER — METRONIDAZOLE 0.75 % VA GEL: 1.0000 | VAGINAL | 1 refills | Status: DC

## 2023-03-22 MED ORDER — FLUCONAZOLE 150 MG PO TABS: ORAL_TABLET | ORAL | 0 refills | Status: DC

## 2023-03-22 MED ORDER — METRONIDAZOLE 500 MG PO TABS: 500.0000 mg | ORAL_TABLET | Freq: Two times a day (BID) | ORAL | 0 refills | Status: AC

## 2023-03-22 NOTE — Patient Instructions (Addendum)
It was great to see you!  Take one oral diflucan tablet now -Start oral flagyl -After completing oral flagyl, take another oral diflucan  May then start twice a week vaginal metronidazole for bacterial vaginosis suppression  If any lingering symptom(s) or new concerns - let me know and we can have you come in for a self-swab   Take care,  [name] PA-C

## 2023-05-20 ENCOUNTER — Other Ambulatory Visit: Payer: Self-pay | Admitting: Physician Assistant

## 2023-05-20 DIAGNOSIS — E282 Polycystic ovarian syndrome: Secondary | ICD-10-CM

## 2023-06-07 ENCOUNTER — Other Ambulatory Visit: Payer: Self-pay | Admitting: Physician Assistant

## 2023-08-28 ENCOUNTER — Other Ambulatory Visit: Payer: Self-pay | Admitting: Physician Assistant

## 2023-08-28 DIAGNOSIS — E282 Polycystic ovarian syndrome: Secondary | ICD-10-CM

## 2023-09-24 ENCOUNTER — Other Ambulatory Visit: Payer: Self-pay | Admitting: Physician Assistant

## 2023-09-27 ENCOUNTER — Telehealth: Admitting: Physician Assistant

## 2023-09-27 ENCOUNTER — Telehealth: Payer: Self-pay | Admitting: Physician Assistant

## 2023-09-27 MED ORDER — FLUCONAZOLE 150 MG PO TABS: 150.0000 mg | ORAL_TABLET | Freq: Once | ORAL | 0 refills | Status: AC

## 2023-09-27 NOTE — Telephone Encounter (Signed)
 Pt would like a call back, she cannot make a virtual for possible yeast infection so she would have to wait around 7 days and wanted to know what she can do until she is able to come in. Please advise.

## 2023-09-27 NOTE — Telephone Encounter (Signed)
 Pt called back told her Lelon Mast said as long as no concerns for sexual transmitted infection or urinary tract infection (UTI), okay to send in one-time diflucan 150 mg tablet. Pt said she just had  complete STD testing done about 2 weeks ago, all was negative and no UTI symptoms. Told her okay will send to pharmacy. Pt verbalized understanding.

## 2023-09-27 NOTE — Telephone Encounter (Signed)
 Left message on voicemail to call office.

## 2023-09-27 NOTE — Telephone Encounter (Signed)
 Veronica Ramirez, pt thinks she has a yeast infection but is on her period. Pt wants to know what she can use till her appt on 4/16 when her period is over. Please advise?

## 2023-10-04 ENCOUNTER — Ambulatory Visit: Admitting: Physician Assistant

## 2023-10-31 ENCOUNTER — Encounter: Payer: Self-pay | Admitting: Physician Assistant

## 2023-10-31 ENCOUNTER — Ambulatory Visit: Payer: Self-pay | Admitting: Physician Assistant

## 2023-10-31 ENCOUNTER — Ambulatory Visit: Admitting: Physician Assistant

## 2023-10-31 VITALS — BP 126/84 | HR 87 | Temp 97.5°F | Ht 68.25 in | Wt 168.0 lb

## 2023-10-31 DIAGNOSIS — N644 Mastodynia: Secondary | ICD-10-CM

## 2023-10-31 DIAGNOSIS — N939 Abnormal uterine and vaginal bleeding, unspecified: Secondary | ICD-10-CM

## 2023-10-31 DIAGNOSIS — Z1231 Encounter for screening mammogram for malignant neoplasm of breast: Secondary | ICD-10-CM

## 2023-10-31 LAB — CBC WITH DIFFERENTIAL/PLATELET
Basophils Absolute: 0 10*3/uL (ref 0.0–0.1)
Basophils Relative: 0.4 % (ref 0.0–3.0)
Eosinophils Absolute: 0.1 10*3/uL (ref 0.0–0.7)
Eosinophils Relative: 1.7 % (ref 0.0–5.0)
HCT: 44.7 % (ref 36.0–46.0)
Hemoglobin: 14.8 g/dL (ref 12.0–15.0)
Lymphocytes Relative: 33.4 % (ref 12.0–46.0)
Lymphs Abs: 1.5 10*3/uL (ref 0.7–4.0)
MCHC: 33.2 g/dL (ref 30.0–36.0)
MCV: 95.2 fl (ref 78.0–100.0)
Monocytes Absolute: 0.3 10*3/uL (ref 0.1–1.0)
Monocytes Relative: 7.4 % (ref 3.0–12.0)
Neutro Abs: 2.6 10*3/uL (ref 1.4–7.7)
Neutrophils Relative %: 57.1 % (ref 43.0–77.0)
Platelets: 313 10*3/uL (ref 150.0–400.0)
RBC: 4.7 Mil/uL (ref 3.87–5.11)
RDW: 13.1 % (ref 11.5–15.5)
WBC: 4.5 10*3/uL (ref 4.0–10.5)

## 2023-10-31 LAB — COMPREHENSIVE METABOLIC PANEL WITH GFR
ALT: 16 U/L (ref 0–35)
AST: 17 U/L (ref 0–37)
Albumin: 4.3 g/dL (ref 3.5–5.2)
Alkaline Phosphatase: 57 U/L (ref 39–117)
BUN: 13 mg/dL (ref 6–23)
CO2: 30 meq/L (ref 19–32)
Calcium: 9.3 mg/dL (ref 8.4–10.5)
Chloride: 101 meq/L (ref 96–112)
Creatinine, Ser: 1.08 mg/dL (ref 0.40–1.20)
GFR: 64.47 mL/min (ref >60.00)
Glucose, Bld: 85 mg/dL (ref 70–99)
Potassium: 4.2 meq/L (ref 3.5–5.1)
Sodium: 139 meq/L (ref 135–145)
Total Bilirubin: 0.5 mg/dL (ref 0.2–1.2)
Total Protein: 6.7 g/dL (ref 6.0–8.3)

## 2023-10-31 LAB — IBC + FERRITIN
Ferritin: 27.8 ng/mL (ref 10.0–291.0)
Iron: 109 ug/dL (ref 42–145)
Saturation Ratios: 36.2 % (ref 20.0–50.0)
TIBC: 301 ug/dL (ref 250.0–450.0)
Transferrin: 215 mg/dL (ref 212.0–360.0)

## 2023-10-31 LAB — HCG, QUANTITATIVE, PREGNANCY: Quantitative HCG: <0.6 m[IU]/mL

## 2023-10-31 NOTE — Patient Instructions (Signed)
 It was great to see you!  Schedule with Antonio Klinefelter  Please follow up with me if symptom(s) worsen in the meantime  Please get your mammogram  Take care,  Ayda Tancredi PA-C

## 2023-10-31 NOTE — Progress Notes (Signed)
 Veronica Ramirez is a 38 y.o. female here for a new problem.  History of Present Illness:   Chief Complaint  Patient presents with   Breast Problem    Pt c/o tenderness in both breasts since April 17th. Does not feel any masses and no discharge from nipples.   Menstrual Problem    Pt c/o irregular menstrual cycles the past 2 months, having cycle every 2 weeks.    HPI  Breast Tenderness Patient complains today of intermittent breast tenderness, stating that symptoms have become more frequent over the past 2 months. Symptoms started around the 17th of April.  She also states that she has been experiencing irregular menstrual cycles, typically having a cycle every 2 weeks. She does report however report having regular cycles when having a ParaGard  IUD currently on year 8-9.   Denies any palpable masses, nipple discharge, pelvic plain, or axilla pain.  No exassive caffeine intake, one cup a day and adequate intake of water. No recent new activities or strength training.    Complain with her spirolactone and metformin  without any complains.  Reports a recent new partner but states she recently get STD testing which returned negative. Has not had her screening mammogram yet.  Does reports a family history of breast cancer in paternal aunts, both diagnosed over 1 years of age.   Past Medical History:  Diagnosis Date   ADHD    Anxiety    Depression    History of chickenpox    Insomnia    followed by [facility]   Interstitial cystitis    followed by [facility]     Social History   Tobacco Use   Smoking status: Former    Current packs/day: 0.00    Types: Cigarettes    Quit date: 2010    Years since quitting: 15.3    Passive exposure: Past   Smokeless tobacco: Never  Vaping Use   Vaping status: Never Used  Substance Use Topics   Alcohol use: Not Currently   Drug use: Not Currently    Past Surgical History:  Procedure Laterality Date   KNEE ARTHROSCOPY W/  MENISCAL REPAIR Left 12/12/2019    Family History  Problem Relation Age of Onset   Multiple sclerosis Mother    Bipolar disorder Mother    COPD Father    Cancer Father        Lung   Heart attack Father    Breast cancer Paternal Aunt    Multiple sclerosis Paternal Aunt    Breast cancer Paternal Aunt    Cancer Paternal Aunt        Breast    Allergies  Allergen Reactions   Adhesive [Tape] Rash    Current Medications:   Current Outpatient Medications:    ALPRAZolam (XANAX) 1 MG tablet, Take 1 tablet by mouth at bedtime., Disp: , Rfl: 2   amphetamine-dextroamphetamine (ADDERALL) 30 MG tablet, Take 1 tablet by mouth 4 (four) times daily., Disp: , Rfl:    cyanocobalamin  (VITAMIN B12) 1000 MCG tablet, Take 1,000 mcg by mouth daily., Disp: , Rfl:    lamoTRIgine (LAMICTAL) 100 MG tablet, Take 100 mg by mouth daily., Disp: , Rfl:    lamoTRIgine (LAMICTAL) 25 MG tablet, Take 25 mg by mouth daily. , Disp: , Rfl:    Melatonin 5 MG CAPS, Take 2 capsules by mouth at bedtime., Disp: , Rfl:    metFORMIN  (GLUCOPHAGE ) 500 MG tablet, TAKE 1 TABLET(500 MG) BY MOUTH TWICE DAILY WITH A MEAL,  Disp: 180 tablet, Rfl: 0   PARAGARD  INTRAUTERINE COPPER  IUD IUD, by Intrauterine route., Disp: , Rfl:    Probiotic Product (PROBIOTIC PO), Take by mouth., Disp: , Rfl:    spironolactone  (ALDACTONE ) 100 MG tablet, TAKE 1 TABLET(100 MG) BY MOUTH DAILY, Disp: 90 tablet, Rfl: 0   SYRINGE-NEEDLE, DISP, 3 ML (B-D 3CC LUER-LOK SYR 25GX1") 25G X 1" 3 ML MISC, USE TO INJECT VIT B12 INJECTIONS, Disp: 6 each, Rfl: 0   traZODone (DESYREL) 100 MG tablet, Take 1 tablet by mouth at bedtime., Disp: , Rfl: 0   Review of Systems:   Review of Systems  Musculoskeletal:        +breast pain    Vitals:   Vitals:   10/31/23 0806  BP: 126/84  Pulse: 87  Temp: (!) 97.5 F (36.4 C)  TempSrc: Temporal  SpO2: 98%  Weight: 168 lb (76.2 kg)  Height: 5' 8.25" (1.734 m)     Body mass index is 25.36 kg/m.  Physical Exam:    Physical Exam Vitals and nursing note reviewed. Exam conducted with a chaperone present.  Constitutional:      General: She is not in acute distress.    Appearance: She is well-developed. She is not ill-appearing or toxic-appearing.  Cardiovascular:     Rate and Rhythm: Normal rate and regular rhythm.     Pulses: Normal pulses.     Heart sounds: Normal heart sounds, S1 normal and S2 normal.  Pulmonary:     Effort: Pulmonary effort is normal.     Breath sounds: Normal breath sounds.  Chest:  Breasts:    Right: Tenderness present. No inverted nipple, mass or nipple discharge.     Left: Tenderness present. No inverted nipple, mass or nipple discharge.  Abdominal:     General: Abdomen is flat. Bowel sounds are normal.     Palpations: Abdomen is soft.     Tenderness: There is no abdominal tenderness.  Skin:    General: Skin is warm and dry.  Neurological:     Mental Status: She is alert.     GCS: GCS eye subscore is 4. GCS verbal subscore is 5. GCS motor subscore is 6.  Psychiatric:        Speech: Speech normal.        Behavior: Behavior normal. Behavior is cooperative.     Assessment and Plan:   Vaginal bleeding Unclear etiology  Declines sexual transmitted infection testing -- as she has just done this and denies new sexual partners since Will order beta quant Recommend follow up with gynecology for evaluation for possible TVUS  If new/worsening symptom(s), reach out   Breast cancer screening by mammogram Ordered today  Breast tenderness Suspect cyclical tenderness  No distinct masses found Recommend annual mammogram and continued monitoring of symptom(s)   Alexander Iba, PA-C  I,Safa M Kadhim,acting as a scribe for Alexander Iba, PA.,have documented all relevant documentation on the behalf of Alexander Iba, PA,as directed by  Alexander Iba, PA while in the presence of Alexander Iba, [place].   I, Alexander Iba, [place], have reviewed all documentation for this  visit. The documentation on 10/31/23 for the exam, diagnosis, procedures, and orders are all accurate and complete.

## 2023-11-14 ENCOUNTER — Other Ambulatory Visit: Payer: Self-pay | Admitting: Physician Assistant

## 2023-11-14 DIAGNOSIS — Z1231 Encounter for screening mammogram for malignant neoplasm of breast: Secondary | ICD-10-CM

## 2023-11-17 ENCOUNTER — Ambulatory Visit
Admission: RE | Admit: 2023-11-17 | Discharge: 2023-11-17 | Disposition: A | Discharge status: Home / Self Care | Source: Ambulatory Visit

## 2023-11-17 DIAGNOSIS — Z1231 Encounter for screening mammogram for malignant neoplasm of breast: Secondary | ICD-10-CM

## 2023-11-20 ENCOUNTER — Other Ambulatory Visit: Payer: Self-pay | Admitting: Physician Assistant

## 2023-11-20 DIAGNOSIS — R928 Other abnormal and inconclusive findings on diagnostic imaging of breast: Secondary | ICD-10-CM

## 2023-11-28 ENCOUNTER — Other Ambulatory Visit: Payer: Self-pay | Admitting: Physician Assistant

## 2023-11-28 DIAGNOSIS — E282 Polycystic ovarian syndrome: Secondary | ICD-10-CM

## 2023-12-06 ENCOUNTER — Ambulatory Visit

## 2023-12-06 ENCOUNTER — Ambulatory Visit
Admission: RE | Admit: 2023-12-06 | Discharge: 2023-12-06 | Disposition: A | Discharge status: Home / Self Care | Source: Ambulatory Visit | Attending: Physician Assistant | Admitting: Physician Assistant

## 2023-12-06 DIAGNOSIS — R928 Other abnormal and inconclusive findings on diagnostic imaging of breast: Secondary | ICD-10-CM

## 2023-12-26 ENCOUNTER — Ambulatory Visit: Payer: Self-pay | Admitting: Nurse Practitioner

## 2023-12-26 ENCOUNTER — Encounter: Payer: Self-pay | Admitting: Nurse Practitioner

## 2023-12-26 VITALS — BP 118/78 | HR 83 | Ht 69.0 in | Wt 173.4 lb

## 2023-12-26 DIAGNOSIS — N644 Mastodynia: Secondary | ICD-10-CM | POA: Diagnosis not present

## 2023-12-26 DIAGNOSIS — N921 Excessive and frequent menstruation with irregular cycle: Secondary | ICD-10-CM | POA: Diagnosis not present

## 2023-12-26 DIAGNOSIS — Z30431 Encounter for routine checking of intrauterine contraceptive device: Secondary | ICD-10-CM

## 2023-12-26 DIAGNOSIS — N926 Irregular menstruation, unspecified: Secondary | ICD-10-CM

## 2023-12-26 NOTE — Progress Notes (Signed)
   Acute Office Visit  Subjective:    Patient ID: Veronica Ramirez, female    DOB: 1986-05-13, 38 y.o.   MRN: [id number]   HPI 38 y.o. presents today for intermittent bilateral breast pain since ~10/05/23. Pain was constant, very tender to touch, all over breasts. Pain has subsided. Denies lumps, nipple discharge, skin changes. Periods have also been irregular the last 2-3 months, occurring about ever 2 weeks, heavier than normal. Paragard  for 8-9 years. Exchanged around this time last time but does not remember why. Feels strings may be longer than they were. LMP 12/11/23. 11/17/23 mammogram showed possible right breast asymmetry, follow up imaging normal. Breast density category C. Denies excessive caffeine intake, drinks 1 cup of coffee daily. No recent change in activity.  No medication changes. Currently being treated for pinched nerve in back. Boyfriend present during visit.   No LMP recorded. Period Pattern: (!) Irregular Menstrual Flow: Heavy Menstrual Control: Other (Comment) Menstrual Control Change Freq (Hours): Pt states she uses the cup Dysmenorrhea: (!) Severe Dysmenorrhea Symptoms: Cramping  Review of Systems  Constitutional: Negative.   Genitourinary:  Positive for menstrual problem.  Right breast: Positive for pain (resolved). Negative for lumps, nipple discharge, redness, swelling, skin changes Left breast: Positive for pain (resolved). Negative for lumps, nipple discharge, redness, swelling, skin changes     Objective:    Physical Exam Exam conducted with a chaperone present.  Constitutional:      Appearance: Normal appearance.  Chest:  Breasts:    Right: Normal.     Left: Normal.  Genitourinary:    General: Normal vulva.     Vagina: Normal.     Cervix: Normal.     Uterus: Normal.      Adnexa: Right adnexa normal and left adnexa normal.     Comments: + IUD strings    BP 118/78 (BP Location: Right Arm)   Pulse 83   Ht 5' 9 (1.753 m)   Wt 173 lb 6.4 oz  (78.7 kg)   SpO2 98%   BMI 25.61 kg/m  Wt Readings from Last 3 Encounters:  12/26/23 173 lb 6.4 oz (78.7 kg)  10/31/23 168 lb (76.2 kg)  03/22/23 164 lb 4 oz (74.5 kg)        Veronica Mole, NP student performed exam with observation.   Assessment & Plan:   Problem List Items Addressed This Visit   None Visit Diagnoses       Menstrual changes    -  Primary   Relevant Orders   US  PELVIC COMPLETE WITH TRANSVAGINAL   TSH     Breast pain in female         Menorrhagia with irregular cycle       Relevant Orders   US  PELVIC COMPLETE WITH TRANSVAGINAL     IUD check up          Plan: Normal breast exam. Reviewed mammogram results and breast density. Pain bilateral and occurred during menstrual irregularity, so likely hormone-related. Pain has resolved. Normal IUD exam. Schedule ultrasound, check TSH. If normal consider exchanging IUD.      Veronica DELENA Shutter DNP, 9:38 AM 12/26/2023

## 2023-12-27 ENCOUNTER — Ambulatory Visit (INDEPENDENT_AMBULATORY_CARE_PROVIDER_SITE_OTHER): Admitting: Nurse Practitioner

## 2023-12-27 ENCOUNTER — Encounter: Payer: Self-pay | Admitting: Nurse Practitioner

## 2023-12-27 ENCOUNTER — Other Ambulatory Visit (INDEPENDENT_AMBULATORY_CARE_PROVIDER_SITE_OTHER)

## 2023-12-27 VITALS — BP 122/80 | HR 89

## 2023-12-27 DIAGNOSIS — Z30432 Encounter for removal of intrauterine contraceptive device: Secondary | ICD-10-CM

## 2023-12-27 DIAGNOSIS — N926 Irregular menstruation, unspecified: Secondary | ICD-10-CM

## 2023-12-27 DIAGNOSIS — Z3009 Encounter for other general counseling and advice on contraception: Secondary | ICD-10-CM

## 2023-12-27 DIAGNOSIS — N921 Excessive and frequent menstruation with irregular cycle: Secondary | ICD-10-CM

## 2023-12-27 DIAGNOSIS — T8332XA Displacement of intrauterine contraceptive device, initial encounter: Secondary | ICD-10-CM | POA: Diagnosis not present

## 2023-12-27 NOTE — Progress Notes (Signed)
   Acute Office Visit  Subjective:    Patient ID: Veronica Ramirez, female    DOB: 11-09-1985, 38 y.o.   MRN: [id number]   HPI 39 y.o. presents today for ultrasound. Seen yesterday for menstrual changes. Periods have been irregular the last 2-3 months, occurring about ever 2 weeks, heavier than normal. H/O irregular periods prior to Paragard . Has had Paragard  for 8-9 years. Exchanged around this time last time but does not remember why. Feels strings may be longer than they were.   Review of Systems  Constitutional: Negative.   Genitourinary:  Positive for menstrual problem.       Objective:    Physical Exam Exam conducted with a chaperone present.  Constitutional:      Appearance: Normal appearance.  Genitourinary:    General: Normal vulva.     Vagina: Normal.     Cervix: Normal.     Comments: IUD strings grasped with rings forceps and removed with ease    BP 122/80 (BP Location: Right Arm, Patient Position: Sitting, Cuff Size: Normal)   Pulse 89   SpO2 99%  Wt Readings from Last 3 Encounters:  12/26/23 173 lb 6.4 oz (78.7 kg)  10/31/23 168 lb (76.2 kg)  03/22/23 164 lb 4 oz (74.5 kg)        Zada Louder, CMA present as Biomedical engineer.   Assessment & Plan:   Problem List Items Addressed This Visit   None Visit Diagnoses       Menstrual changes    -  Primary     Menorrhagia with irregular cycle         Malpositioned intrauterine device (IUD), initial encounter         Encounter for IUD removal       Relevant Orders   IUD removal     General counseling and advice on female contraception       Relevant Orders   IUD Insertion      Vaginal ultrasound (comparison is made to 2018 ultrasound): Anteverted uterus, normal size and shape, 26 x 18 mm degenerating intramural fibroid.  Thin, symmetrical endometrium.  No masses or thickening seen.  IUD malpositioned in the endometrium.  Right ovary 33 x 27 mm simple avascular cyst.  Left ovary 31 x 25 mm simple avascular cyst.   No adnexal masses, no free fluid.  Plan: Ultrasound reviewed. IUD malpositioned within the EM, otherwise unremarkable. Removed today, patient tolerated well. Will return for Paragard  insertion. Offered Phexxi in the interim but declined. Will abstain or use alternative contraceptive.     Annabella DELENA Shutter DNP, 9:04 AM 12/27/2023

## 2023-12-30 ENCOUNTER — Other Ambulatory Visit: Payer: Self-pay | Admitting: Physician Assistant

## 2024-01-01 ENCOUNTER — Other Ambulatory Visit: Payer: Self-pay | Admitting: Physician Assistant

## 2024-01-01 MED ORDER — SPIRONOLACTONE 100 MG PO TABS: ORAL_TABLET | ORAL | 0 refills | Status: DC

## 2024-01-01 NOTE — Telephone Encounter (Signed)
 Copied from CRM [id number]. Topic: Clinical - Medication Refill >> Jan 01, 2024 11:43 AM Rosina BIRCH wrote: Medication: spironolactone  (ALDACTONE ) 100 MG tablet  Has the patient contacted their pharmacy? Yes (Agent: If no, request that the patient contact the pharmacy for the refill. If patient does not wish to contact the pharmacy document the reason why and proceed with request.) (Agent: If yes, when and what did the pharmacy advise?)  This is the patient's preferred pharmacy:  [id number] [place], [place] - [address] AT [facility] [address] [facility] [address] Phone: [phone] Fax: [phone]  Is this the correct pharmacy for this prescription? Yes If no, delete pharmacy and type the correct one.   Has the prescription been filled recently? No  Is the patient out of the medication? Yes  Has the patient been seen for an appointment in the last year OR does the patient have an upcoming appointment? Yes  Can we respond through MyChart? Yes  Agent: Please be advised that Rx refills may take up to 3 business days. We ask that you follow-up with your pharmacy.

## 2024-01-23 ENCOUNTER — Other Ambulatory Visit: Payer: Self-pay | Admitting: Physician Assistant

## 2024-01-23 NOTE — Progress Notes (Unsigned)
   Veronica Ramirez 1985/06/29 [id number]   History:  38 y.o. G1P0010 presents for insertion of Paragard  IUD. IUD removed 12/27/23 due to malpositioning. Was having some menstrual changes. Pt has been counseled about risks and benefits as well as complications. Would like referral to GI for hemorrhoids. Boyfriend present.   Patient's last menstrual period was 01/05/2024 (exact date). STD testing: 08/23/2023 neg  Past medical history, past surgical history, family history and social history were all reviewed and documented in the EPIC chart.  ROS:  A ROS was performed and pertinent positives and negatives are included.  Exam: Vitals:   01/24/24 0822  BP: 118/80  Pulse: 87  SpO2: 99%   There is no height or weight on file to calculate BMI.  Pelvic exam: Vulva:  normal female genitalia Vagina:  normal vagina, no discharge, exudate, lesion, or erythema Cervix:  Non-tender, Negative CMT, no lesions or redness. Uterus:  normal shape, position and consistency    Procedure:  Timeout performed and written consent obtained.  Speculum inserted.  Cervix visualized and cleansed with Chlorhexidine x 3. Sterile gloves applied. Tenaculum placed on anterior cervix. Then uterus sounded to 8 cm. IUD inserted easily. Strings trimmed to 3 cm. Minimal bleeding noted.  Pt tolerated the procedure well.  Chaperone present: Veronica Ramirez, CMA   Assessment/Plan:  Insertion of Paragard  IUD             UPT neg   Return for recheck 4-6 weeks Pt aware to call for any concerns Pt aware removal due no later than 10 years from insertion date, IUD card given to pt.   Veronica DELENA Shutter DNP, 8:49 AM 01/24/2024

## 2024-01-24 ENCOUNTER — Ambulatory Visit (INDEPENDENT_AMBULATORY_CARE_PROVIDER_SITE_OTHER): Admitting: Nurse Practitioner

## 2024-01-24 ENCOUNTER — Encounter: Payer: Self-pay | Admitting: Nurse Practitioner

## 2024-01-24 VITALS — BP 118/80 | HR 87

## 2024-01-24 DIAGNOSIS — Z01812 Encounter for preprocedural laboratory examination: Secondary | ICD-10-CM | POA: Diagnosis not present

## 2024-01-24 DIAGNOSIS — Z30430 Encounter for insertion of intrauterine contraceptive device: Secondary | ICD-10-CM

## 2024-01-24 DIAGNOSIS — K649 Unspecified hemorrhoids: Secondary | ICD-10-CM

## 2024-01-24 DIAGNOSIS — Z3009 Encounter for other general counseling and advice on contraception: Secondary | ICD-10-CM | POA: Diagnosis not present

## 2024-01-24 LAB — PREGNANCY, URINE: Preg Test, Ur: NEGATIVE

## 2024-01-24 MED ORDER — PARAGARD INTRAUTERINE COPPER IU IUD
1.0000 | INTRAUTERINE_SYSTEM | Freq: Once | INTRAUTERINE | Status: AC
  Administered 2024-01-24: 1 via INTRAUTERINE

## 2024-02-22 ENCOUNTER — Other Ambulatory Visit: Payer: Self-pay | Admitting: Nurse Practitioner

## 2024-02-22 ENCOUNTER — Ambulatory Visit (INDEPENDENT_AMBULATORY_CARE_PROVIDER_SITE_OTHER)

## 2024-02-22 ENCOUNTER — Ambulatory Visit (INDEPENDENT_AMBULATORY_CARE_PROVIDER_SITE_OTHER): Admitting: Nurse Practitioner

## 2024-02-22 ENCOUNTER — Encounter: Payer: Self-pay | Admitting: Nurse Practitioner

## 2024-02-22 VITALS — BP 118/80 | Ht 68.0 in | Wt 173.0 lb

## 2024-02-22 DIAGNOSIS — R102 Pelvic and perineal pain: Secondary | ICD-10-CM

## 2024-02-22 DIAGNOSIS — T8332XA Displacement of intrauterine contraceptive device, initial encounter: Secondary | ICD-10-CM

## 2024-02-22 DIAGNOSIS — Z30015 Encounter for initial prescription of vaginal ring hormonal contraceptive: Secondary | ICD-10-CM

## 2024-02-22 DIAGNOSIS — Z30431 Encounter for routine checking of intrauterine contraceptive device: Secondary | ICD-10-CM

## 2024-02-22 MED ORDER — ANNOVERA 0.013-0.15 MG/24HR VA RING: 1.0000 | VAGINAL_RING | Freq: Once | VAGINAL | 0 refills | Status: DC

## 2024-02-22 NOTE — Addendum Note (Signed)
 Addended byBETHA PRENTISS RIGGS on: 02/22/2024 09:59 AM   Modules accepted: Orders

## 2024-02-22 NOTE — Progress Notes (Addendum)
     History:  38 y.o. G1P0010 here today for today for IUD string check; Paragard  IUD was placed 01/24/24. Has some pain during sex and thinks she can feel something on the side of her cervix. No bleeding. Previous IUD exchanged after ~8 years due to migration.   The following portions of the patient's history were reviewed and updated as appropriate: allergies, current medications, past family history, past medical history, past social history, past surgical history and problem list.  Review of Systems:  Pertinent items are noted in HPI.   Objective:  Physical Exam Blood pressure 118/80, height 5' 8 (1.727 m), weight 173 lb (78.5 kg), last menstrual period 01/05/2024. Gen: NAD Abd: Soft, nontender and nondistended Pelvic: Normal appearing external genitalia; normal appearing vaginal mucosa and cervix.  IUD strings visualized, about 3 cm in length outside cervix.   Assessment & Plan:  Normal pelvic exam. Ultrasound to check position.   Vaginal ultrasound (comparison is made to 12/27/2023 ultrasound): Anteverted uterus, normal size and shape, degenerating fibroid anterior noted (26 x 18 mm).  Thin, symmetrical endometrium.  No masses or thickening seen.  IUD curved toward the patient's right with the left arm protruding into the myometrium.  Both ovaries normal size with normal follicle pattern and normal perfusion.  No adnexal masses, no free fluid.  Plan: IUD malpositioned. IUD removed with ease, patient tolerated well. This is second malpositioned IUD, could be d/t fibroid. Discussed alternatives contraceptive options. Considering BTL and will schedule a consultation. Annovera  prescribed and educated on proper use. Aware can use for 13 months cyclically or continuously.    Annabella Shutter, DNP

## 2024-02-22 NOTE — Telephone Encounter (Signed)
 Med refill request: Segesterone-Ethinyl Estradiol (ANNOVERA ) 0.15-0.013 MG/24HR RING  Dispense: #1 with 0 refills Last office visit: 02/22/24 Next AEX: not yet scheduled Last MMG (if hormonal med): 12/06/23 Refill authorized? Please Advise.

## 2024-02-29 ENCOUNTER — Other Ambulatory Visit: Payer: Self-pay | Admitting: Physician Assistant

## 2024-02-29 DIAGNOSIS — E282 Polycystic ovarian syndrome: Secondary | ICD-10-CM

## 2024-03-12 NOTE — Progress Notes (Signed)
 38 y.o. G45P0010 female with fibroid, PCOS (on metformin ), IC, ADHD, anxiety and depression here for surgical consultation for BTL. Single. Referred by T. Prentiss, NP.  Patient's last menstrual period was 02/20/2024 (exact date). Period Duration (Days): 5 Period Pattern: Regular Menstrual Flow: Moderate Menstrual Control: Other (Comment) (menstrual cup) Dysmenorrhea: (!) Mild  She has had 2 malpositioned ParaGard  IUDs, could be d/t fibroid-2.5cm. Cycles well controlled without hormones.  Last PAP:    Component Value Date/Time   DIAGPAP  08/12/2021 0918    - Negative for intraepithelial lesion or malignancy (NILM)   HPVHIGH Negative 08/12/2021 0918   ADEQPAP  08/12/2021 0918    Satisfactory for evaluation; transformation zone component ABSENT.   Birth control: insert Sexually active: Yes    GYN HISTORY: No sig hx  OB History  Gravida Para Term Preterm AB Living  1    1   SAB IAB Ectopic Multiple Live Births          # Outcome Date GA Lbr Len/2nd Weight Sex Type Anes PTL Lv  1 AB             Past Medical History:  Diagnosis Date   ADHD    Anxiety    Depression    History of chickenpox    Insomnia    followed by [facility]   Interstitial cystitis    followed by [facility]    Past Surgical History:  Procedure Laterality Date   KNEE ARTHROSCOPY W/ MENISCAL REPAIR Left 12/12/2019    Current Outpatient Medications on File Prior to Visit  Medication Sig Dispense Refill   ALPRAZolam (XANAX) 1 MG tablet Take 1 tablet by mouth at bedtime.  2   amphetamine-dextroamphetamine (ADDERALL XR) 30 MG 24 hr capsule TAKE 1 CAPSULE BY MOUTH DAILY IN THE MORNING Oral; Duration: 30 Days     amphetamine-dextroamphetamine (ADDERALL XR) 30 MG 24 hr capsule Take 30 mg by mouth every morning.     cyanocobalamin  (VITAMIN B12) 1000 MCG tablet Take 1,000 mcg by mouth daily.     fluconazole  (DIFLUCAN ) 150 MG tablet TAKE 1 TABLET BY MOUTH BY MOUTH AS NEEDED. MAY REPEAT IN  3 TO 5 DAYS IF SYMPTOMS PERSIST     lamoTRIgine (LAMICTAL) 100 MG tablet Take 100 mg by mouth daily.     lamoTRIgine (LAMICTAL) 25 MG tablet Take 25 mg by mouth daily.      Melatonin 5 MG CAPS Take 2 capsules by mouth at bedtime.     meloxicam  (MOBIC ) 15 MG tablet Take 15 mg by mouth daily.     metFORMIN  (GLUCOPHAGE ) 500 MG tablet TAKE 1 TABLET(500 MG) BY MOUTH TWICE DAILY WITH A MEAL 180 tablet 0   methocarbamol (ROBAXIN) 500 MG tablet Take 500-1,000 mg by mouth every 6 (six) hours as needed.     metroNIDAZOLE  (METROGEL ) 0.75 % vaginal gel INSERT 1 APPLICATORFUL VAGINALLY TWICE DAILY FOR UP TO 6 MONTHS FOR SUPPRESIVE THERAPY     Probiotic Product (PROBIOTIC PO) Take by mouth.     Segesterone-Ethinyl Estradiol (ANNOVERA ) 0.15-0.013 MG/24HR RING Continuous use (only remove monthly to wash and reinsert right after for 13 months) 1 each 0   spironolactone  (ALDACTONE ) 100 MG tablet TAKE 1 TABLET(100 MG) BY MOUTH DAILY 90 tablet 0   SYRINGE-NEEDLE, DISP, 3 ML (B-D 3CC LUER-LOK SYR 25GX1) 25G X 1 3 ML MISC USE TO INJECT VIT B12 INJECTIONS 6 each 0   traZODone (DESYREL) 100 MG tablet Take 1 tablet by mouth at  bedtime.  0   No current facility-administered medications on file prior to visit.    Allergies  Allergen Reactions   Adhesive [Tape] Rash      PE Today's Vitals   03/13/24 1548  BP: 106/64  Pulse: 93  Temp: 98.6 F (37 C)  TempSrc: Oral  SpO2: 99%  Weight: 170 lb (77.1 kg)  Height: 5' 9.25 (1.759 m)   Body mass index is 24.92 kg/m.  Physical Exam Vitals reviewed.  Constitutional:      General: She is not in acute distress.    Appearance: Normal appearance.  HENT:     Head: Normocephalic and atraumatic.     Nose: Nose normal.  Eyes:     Extraocular Movements: Extraocular movements intact.     Conjunctiva/sclera: Conjunctivae normal.  Pulmonary:     Effort: Pulmonary effort is normal.  Musculoskeletal:        General: Normal range of motion.     Cervical back:  Normal range of motion.  Neurological:     General: No focal deficit present.     Mental Status: She is alert.  Psychiatric:        Mood and Affect: Mood normal.        Behavior: Behavior normal.      Assessment and Plan:        Encounter for consultation for female sterilization -     Ambulatory Referral For Surgery Scheduling   Contraceptive options were reviewed, including implant, IUDs and sterilization as indicated. Patient desires permanent sterilization.  Recommend via laparoscopic bilateral salpingectomy with general endotracheal anesthesia. Reviewed benefits including unintended pregnancy rates <1% and decreased risk of fallopian tubes and ovary cancers, as well as risks including regret, inability for reversal, bleeding, infection, and damage to surrounding organs and vessels. Patient agrees to blood transfusion if indicated. Reviewed outpatient procedure that will require transportation to hospital and 1-2 weeks out of work. NPO after midnight.  Preop checklist: Antibiotics: none DVT ppx: SCDs Postop visit: 2 week Additional clearance: none Tubal papers signed N/A   patient request Feb, March 2026 for procedure. RTO for preop once scheduled  Vera LULLA Pa, MD

## 2024-03-13 ENCOUNTER — Ambulatory Visit: Admitting: Obstetrics and Gynecology

## 2024-03-13 ENCOUNTER — Encounter: Payer: Self-pay | Admitting: Obstetrics and Gynecology

## 2024-03-13 VITALS — BP 106/64 | HR 93 | Temp 98.6°F | Ht 69.25 in | Wt 170.0 lb

## 2024-03-13 DIAGNOSIS — Z3009 Encounter for other general counseling and advice on contraception: Secondary | ICD-10-CM | POA: Diagnosis not present

## 2024-06-02 ENCOUNTER — Other Ambulatory Visit: Payer: Self-pay | Admitting: Physician Assistant

## 2024-06-02 DIAGNOSIS — E282 Polycystic ovarian syndrome: Secondary | ICD-10-CM

## 2024-06-21 ENCOUNTER — Other Ambulatory Visit: Payer: Self-pay | Admitting: Physician Assistant

## 2024-07-03 ENCOUNTER — Encounter: Payer: Self-pay | Admitting: Physician Assistant

## 2024-07-29 ENCOUNTER — Ambulatory Visit: Admitting: Physician Assistant
